# Patient Record
Sex: Female | Born: 1935 | Race: White | Hispanic: No | Marital: Married | Smoking: Never smoker
Health system: Southern US, Community
[De-identification: ages and names within clinical notes are randomized; demographics above are authoritative.]

## PROBLEM LIST (undated history)

## (undated) DIAGNOSIS — E119 Type 2 diabetes mellitus without complications: Secondary | ICD-10-CM

## (undated) DIAGNOSIS — G4730 Sleep apnea, unspecified: Secondary | ICD-10-CM

## (undated) DIAGNOSIS — S72001A Fracture of unspecified part of neck of right femur, initial encounter for closed fracture: Secondary | ICD-10-CM

## (undated) DIAGNOSIS — L409 Psoriasis, unspecified: Secondary | ICD-10-CM

## (undated) DIAGNOSIS — E785 Hyperlipidemia, unspecified: Secondary | ICD-10-CM

## (undated) HISTORY — PX: BREAST BIOPSY: SHX20

## (undated) HISTORY — PX: OTHER SURGICAL HISTORY: SHX169

---

## 2010-11-09 ENCOUNTER — Encounter: Admission: RE | Admit: 2010-11-09 | Discharge: 2010-11-09 | Payer: Self-pay | Admitting: Neurological Surgery

## 2012-01-05 ENCOUNTER — Ambulatory Visit (INDEPENDENT_AMBULATORY_CARE_PROVIDER_SITE_OTHER): Payer: Medicare Other

## 2012-01-05 DIAGNOSIS — J159 Unspecified bacterial pneumonia: Secondary | ICD-10-CM

## 2012-01-05 DIAGNOSIS — R05 Cough: Secondary | ICD-10-CM

## 2012-01-05 DIAGNOSIS — R0602 Shortness of breath: Secondary | ICD-10-CM

## 2012-01-05 DIAGNOSIS — R059 Cough, unspecified: Secondary | ICD-10-CM

## 2012-01-07 ENCOUNTER — Ambulatory Visit (INDEPENDENT_AMBULATORY_CARE_PROVIDER_SITE_OTHER): Payer: Medicare Other

## 2012-01-07 DIAGNOSIS — IMO0001 Reserved for inherently not codable concepts without codable children: Secondary | ICD-10-CM

## 2012-01-07 DIAGNOSIS — J159 Unspecified bacterial pneumonia: Secondary | ICD-10-CM

## 2012-01-10 ENCOUNTER — Ambulatory Visit (INDEPENDENT_AMBULATORY_CARE_PROVIDER_SITE_OTHER): Payer: Medicare Other

## 2012-01-10 DIAGNOSIS — J159 Unspecified bacterial pneumonia: Secondary | ICD-10-CM

## 2014-03-07 ENCOUNTER — Inpatient Hospital Stay (HOSPITAL_COMMUNITY)
Admission: EM | Admit: 2014-03-07 | Discharge: 2014-03-11 | DRG: 470 | Disposition: A | Discharge status: Rehabilitation Facility | Payer: Medicare Other | Attending: Internal Medicine | Admitting: Internal Medicine

## 2014-03-07 DIAGNOSIS — R030 Elevated blood-pressure reading, without diagnosis of hypertension: Secondary | ICD-10-CM | POA: Diagnosis present

## 2014-03-07 DIAGNOSIS — W010XXA Fall on same level from slipping, tripping and stumbling without subsequent striking against object, initial encounter: Secondary | ICD-10-CM | POA: Diagnosis present

## 2014-03-07 DIAGNOSIS — E1142 Type 2 diabetes mellitus with diabetic polyneuropathy: Secondary | ICD-10-CM | POA: Diagnosis present

## 2014-03-07 DIAGNOSIS — D72829 Elevated white blood cell count, unspecified: Secondary | ICD-10-CM | POA: Diagnosis present

## 2014-03-07 DIAGNOSIS — G4733 Obstructive sleep apnea (adult) (pediatric): Secondary | ICD-10-CM | POA: Diagnosis present

## 2014-03-07 DIAGNOSIS — Z79899 Other long term (current) drug therapy: Secondary | ICD-10-CM

## 2014-03-07 DIAGNOSIS — K219 Gastro-esophageal reflux disease without esophagitis: Secondary | ICD-10-CM | POA: Diagnosis present

## 2014-03-07 DIAGNOSIS — D62 Acute posthemorrhagic anemia: Secondary | ICD-10-CM | POA: Diagnosis not present

## 2014-03-07 DIAGNOSIS — IMO0001 Reserved for inherently not codable concepts without codable children: Secondary | ICD-10-CM | POA: Diagnosis present

## 2014-03-07 DIAGNOSIS — L408 Other psoriasis: Secondary | ICD-10-CM | POA: Diagnosis present

## 2014-03-07 DIAGNOSIS — W19XXXA Unspecified fall, initial encounter: Secondary | ICD-10-CM

## 2014-03-07 DIAGNOSIS — Z7901 Long term (current) use of anticoagulants: Secondary | ICD-10-CM

## 2014-03-07 DIAGNOSIS — S72001A Fracture of unspecified part of neck of right femur, initial encounter for closed fracture: Secondary | ICD-10-CM | POA: Diagnosis present

## 2014-03-07 DIAGNOSIS — K5900 Constipation, unspecified: Secondary | ICD-10-CM | POA: Diagnosis not present

## 2014-03-07 DIAGNOSIS — E119 Type 2 diabetes mellitus without complications: Secondary | ICD-10-CM | POA: Diagnosis present

## 2014-03-07 DIAGNOSIS — Y92009 Unspecified place in unspecified non-institutional (private) residence as the place of occurrence of the external cause: Secondary | ICD-10-CM

## 2014-03-07 DIAGNOSIS — S72033A Displaced midcervical fracture of unspecified femur, initial encounter for closed fracture: Principal | ICD-10-CM | POA: Diagnosis present

## 2014-03-07 DIAGNOSIS — E1149 Type 2 diabetes mellitus with other diabetic neurological complication: Secondary | ICD-10-CM | POA: Diagnosis present

## 2014-03-07 DIAGNOSIS — E785 Hyperlipidemia, unspecified: Secondary | ICD-10-CM | POA: Diagnosis present

## 2014-03-07 DIAGNOSIS — Z880 Allergy status to penicillin: Secondary | ICD-10-CM

## 2014-03-07 HISTORY — DX: Fracture of unspecified part of neck of right femur, initial encounter for closed fracture: S72.001A

## 2014-03-07 HISTORY — DX: Hyperlipidemia, unspecified: E78.5

## 2014-03-07 HISTORY — DX: Psoriasis, unspecified: L40.9

## 2014-03-07 HISTORY — DX: Sleep apnea, unspecified: G47.30

## 2014-03-07 HISTORY — DX: Type 2 diabetes mellitus without complications: E11.9

## 2014-03-07 NOTE — ED Notes (Signed)
Per EMS: pt coming from home with c/o right hip pain. Pt was getting up from bed and tripped on a dog bed. Pt denies LOC, no head trauma. Pt states she can not move her right leg but can move toes and feet. Pt has good pulses, color, and sensation. Pt was given 50 mcg of Fentanyl and 4 mg of Zofran. Pt is A&Ox4, respirations equal and unlabored, skin warm and dry.

## 2014-03-08 ENCOUNTER — Other Ambulatory Visit: Payer: Self-pay

## 2014-03-08 ENCOUNTER — Encounter (HOSPITAL_COMMUNITY): Payer: Self-pay | Admitting: Emergency Medicine

## 2014-03-08 ENCOUNTER — Encounter (HOSPITAL_COMMUNITY)
Admission: EM | Disposition: A | Discharge status: Rehabilitation Facility | Payer: Self-pay | Source: Home / Self Care | Attending: Internal Medicine

## 2014-03-08 ENCOUNTER — Emergency Department (HOSPITAL_COMMUNITY): Payer: Medicare Other

## 2014-03-08 ENCOUNTER — Inpatient Hospital Stay (HOSPITAL_COMMUNITY): Payer: Medicare Other

## 2014-03-08 ENCOUNTER — Encounter (HOSPITAL_COMMUNITY): Payer: Medicare Other | Admitting: Certified Registered Nurse Anesthetist

## 2014-03-08 ENCOUNTER — Inpatient Hospital Stay (HOSPITAL_COMMUNITY): Payer: Medicare Other | Admitting: Certified Registered Nurse Anesthetist

## 2014-03-08 DIAGNOSIS — S72001A Fracture of unspecified part of neck of right femur, initial encounter for closed fracture: Secondary | ICD-10-CM

## 2014-03-08 DIAGNOSIS — R030 Elevated blood-pressure reading, without diagnosis of hypertension: Secondary | ICD-10-CM

## 2014-03-08 DIAGNOSIS — D72829 Elevated white blood cell count, unspecified: Secondary | ICD-10-CM

## 2014-03-08 DIAGNOSIS — IMO0001 Reserved for inherently not codable concepts without codable children: Secondary | ICD-10-CM | POA: Diagnosis present

## 2014-03-08 DIAGNOSIS — S72009A Fracture of unspecified part of neck of unspecified femur, initial encounter for closed fracture: Secondary | ICD-10-CM

## 2014-03-08 DIAGNOSIS — E119 Type 2 diabetes mellitus without complications: Secondary | ICD-10-CM

## 2014-03-08 HISTORY — PX: HIP ARTHROPLASTY: SHX981

## 2014-03-08 HISTORY — DX: Fracture of unspecified part of neck of right femur, initial encounter for closed fracture: S72.001A

## 2014-03-08 LAB — SURGICAL PCR SCREEN
MRSA, PCR: NEGATIVE
STAPHYLOCOCCUS AUREUS: NEGATIVE

## 2014-03-08 LAB — URINALYSIS, ROUTINE W REFLEX MICROSCOPIC
Bilirubin Urine: NEGATIVE
Glucose, UA: >1000 mg/dL — AB
Hgb urine dipstick: NEGATIVE
KETONES UR: 40 mg/dL — AB
LEUKOCYTES UA: NEGATIVE
NITRITE: NEGATIVE
PH: 5 (ref 5.0–8.0)
Protein, ur: NEGATIVE mg/dL
Specific Gravity, Urine: 1.024 (ref 1.005–1.030)
Urobilinogen, UA: 0.2 mg/dL (ref 0.0–1.0)

## 2014-03-08 LAB — URINE MICROSCOPIC-ADD ON

## 2014-03-08 LAB — COMPREHENSIVE METABOLIC PANEL
ALBUMIN: 3.8 g/dL (ref 3.5–5.2)
ALK PHOS: 71 U/L (ref 39–117)
ALT: 20 U/L (ref 0–35)
ALT: 21 U/L (ref 0–35)
AST: 24 U/L (ref 0–37)
AST: 23 U/L (ref 0–37)
Albumin: 3.8 g/dL (ref 3.5–5.2)
Alkaline Phosphatase: 74 U/L (ref 39–117)
BILIRUBIN TOTAL: 0.3 mg/dL (ref 0.3–1.2)
BUN: 17 mg/dL (ref 6–23)
BUN: 16 mg/dL (ref 6–23)
CALCIUM: 9.7 mg/dL (ref 8.4–10.5)
CHLORIDE: 98 meq/L (ref 96–112)
CHLORIDE: 100 meq/L (ref 96–112)
CO2: 22 mEq/L (ref 19–32)
CO2: 20 mEq/L (ref 19–32)
CREATININE: 1.04 mg/dL (ref 0.50–1.10)
CREATININE: 0.9 mg/dL (ref 0.50–1.10)
Calcium: 9.6 mg/dL (ref 8.4–10.5)
GFR calc Af Amer: 56 mL/min — ABNORMAL LOW (ref >90)
GFR calc Af Amer: 66 mL/min — ABNORMAL LOW (ref >90)
GFR calc non Af Amer: 48 mL/min — ABNORMAL LOW (ref >90)
GFR calc non Af Amer: 57 mL/min — ABNORMAL LOW (ref >90)
Glucose, Bld: 245 mg/dL — ABNORMAL HIGH (ref 70–99)
Glucose, Bld: 229 mg/dL — ABNORMAL HIGH (ref 70–99)
POTASSIUM: 4 meq/L (ref 3.7–5.3)
Potassium: 4.2 mEq/L (ref 3.7–5.3)
SODIUM: 138 meq/L (ref 137–147)
Sodium: 140 mEq/L (ref 137–147)
TOTAL PROTEIN: 7.5 g/dL (ref 6.0–8.3)
Total Bilirubin: 0.4 mg/dL (ref 0.3–1.2)
Total Protein: 7.3 g/dL (ref 6.0–8.3)

## 2014-03-08 LAB — CBC WITH DIFFERENTIAL/PLATELET
BASOS ABS: 0 10*3/uL (ref 0.0–0.1)
BASOS PCT: 0 % (ref 0–1)
Basophils Absolute: 0 10*3/uL (ref 0.0–0.1)
Basophils Relative: 0 % (ref 0–1)
EOS ABS: 0 10*3/uL (ref 0.0–0.7)
EOS PCT: 0 % (ref 0–5)
Eosinophils Absolute: 0.1 10*3/uL (ref 0.0–0.7)
Eosinophils Relative: 0 % (ref 0–5)
HCT: 39 % (ref 36.0–46.0)
HCT: 39.3 % (ref 36.0–46.0)
HEMOGLOBIN: 13.3 g/dL (ref 12.0–15.0)
Hemoglobin: 13.4 g/dL (ref 12.0–15.0)
LYMPHS ABS: 1.2 10*3/uL (ref 0.7–4.0)
LYMPHS PCT: 6 % — AB (ref 12–46)
Lymphocytes Relative: 8 % — ABNORMAL LOW (ref 12–46)
Lymphs Abs: 1.4 10*3/uL (ref 0.7–4.0)
MCH: 32.8 pg (ref 26.0–34.0)
MCH: 32.4 pg (ref 26.0–34.0)
MCHC: 34.4 g/dL (ref 30.0–36.0)
MCHC: 33.8 g/dL (ref 30.0–36.0)
MCV: 95.4 fL (ref 78.0–100.0)
MCV: 95.6 fL (ref 78.0–100.0)
Monocytes Absolute: 0.8 10*3/uL (ref 0.1–1.0)
Monocytes Absolute: 1.6 10*3/uL — ABNORMAL HIGH (ref 0.1–1.0)
Monocytes Relative: 4 % (ref 3–12)
Monocytes Relative: 8 % (ref 3–12)
NEUTROS ABS: 15 10*3/uL — AB (ref 1.7–7.7)
NEUTROS PCT: 87 % — AB (ref 43–77)
Neutro Abs: 16.1 10*3/uL — ABNORMAL HIGH (ref 1.7–7.7)
Neutrophils Relative %: 85 % — ABNORMAL HIGH (ref 43–77)
PLATELETS: 260 10*3/uL (ref 150–400)
Platelets: 258 10*3/uL (ref 150–400)
RBC: 4.09 MIL/uL (ref 3.87–5.11)
RBC: 4.11 MIL/uL (ref 3.87–5.11)
RDW: 12.9 % (ref 11.5–15.5)
RDW: 13.1 % (ref 11.5–15.5)
WBC: 17.2 10*3/uL — AB (ref 4.0–10.5)
WBC: 18.9 10*3/uL — AB (ref 4.0–10.5)

## 2014-03-08 LAB — GLUCOSE, CAPILLARY
GLUCOSE-CAPILLARY: 233 mg/dL — AB (ref 70–99)
GLUCOSE-CAPILLARY: 169 mg/dL — AB (ref 70–99)
GLUCOSE-CAPILLARY: 153 mg/dL — AB (ref 70–99)
GLUCOSE-CAPILLARY: 178 mg/dL — AB (ref 70–99)
Glucose-Capillary: 107 mg/dL — ABNORMAL HIGH (ref 70–99)
Glucose-Capillary: 166 mg/dL — ABNORMAL HIGH (ref 70–99)

## 2014-03-08 LAB — URINE CULTURE: Colony Count: 35000

## 2014-03-08 LAB — PROTIME-INR
INR: 1.02 (ref 0.00–1.49)
INR: 1.08 (ref 0.00–1.49)
Prothrombin Time: 13.2 seconds (ref 11.6–15.2)
Prothrombin Time: 13.8 seconds (ref 11.6–15.2)

## 2014-03-08 LAB — TYPE AND SCREEN
ABO/RH(D): B POS
Antibody Screen: NEGATIVE

## 2014-03-08 LAB — ABO/RH: ABO/RH(D): B POS

## 2014-03-08 SURGERY — HEMIARTHROPLASTY, HIP, DIRECT ANTERIOR APPROACH, FOR FRACTURE
Anesthesia: General | Laterality: Right

## 2014-03-08 MED ORDER — FENTANYL CITRATE 0.05 MG/ML IJ SOLN
INTRAMUSCULAR | Status: DC | PRN
  Administered 2014-03-08: 75 ug via INTRAVENOUS
  Administered 2014-03-08: 25 ug via INTRAVENOUS
  Administered 2014-03-08: 75 ug via INTRAVENOUS
  Administered 2014-03-08: 25 ug via INTRAVENOUS

## 2014-03-08 MED ORDER — MORPHINE SULFATE 2 MG/ML IJ SOLN
0.5000 mg | INTRAMUSCULAR | Status: DC | PRN
  Administered 2014-03-08: 0.5 mg via INTRAVENOUS
  Filled 2014-03-08: qty 1

## 2014-03-08 MED ORDER — FENTANYL CITRATE 0.05 MG/ML IJ SOLN
INTRAMUSCULAR | Status: AC
  Filled 2014-03-08: qty 5

## 2014-03-08 MED ORDER — ONDANSETRON HCL 4 MG/2ML IJ SOLN
4.0000 mg | Freq: Once | INTRAMUSCULAR | Status: AC
  Administered 2014-03-08: 4 mg via INTRAVENOUS
  Filled 2014-03-08: qty 2

## 2014-03-08 MED ORDER — PHENYLEPHRINE HCL 10 MG/ML IJ SOLN
INTRAMUSCULAR | Status: DC | PRN
  Administered 2014-03-08 (×2): 80 ug via INTRAVENOUS
  Administered 2014-03-08 (×3): 40 ug via INTRAVENOUS
  Administered 2014-03-08: 80 ug via INTRAVENOUS
  Administered 2014-03-08: 40 ug via INTRAVENOUS
  Administered 2014-03-08 (×2): 80 ug via INTRAVENOUS
  Administered 2014-03-08 (×2): 40 ug via INTRAVENOUS
  Administered 2014-03-08: 80 ug via INTRAVENOUS

## 2014-03-08 MED ORDER — HYDROCODONE-ACETAMINOPHEN 5-325 MG PO TABS: 1.0000 | ORAL_TABLET | Freq: Four times a day (QID) | ORAL | Status: DC | PRN

## 2014-03-08 MED ORDER — ONDANSETRON HCL 4 MG/2ML IJ SOLN: 4.0000 mg | Freq: Once | INTRAMUSCULAR | Status: DC | PRN

## 2014-03-08 MED ORDER — DOCUSATE SODIUM 100 MG PO CAPS
100.0000 mg | ORAL_CAPSULE | Freq: Two times a day (BID) | ORAL | Status: DC
  Administered 2014-03-08 – 2014-03-11 (×5): 100 mg via ORAL
  Filled 2014-03-08 (×7): qty 1

## 2014-03-08 MED ORDER — ENOXAPARIN SODIUM 40 MG/0.4ML ~~LOC~~ SOLN
40.0000 mg | SUBCUTANEOUS | Status: DC
  Administered 2014-03-09 – 2014-03-11 (×3): 40 mg via SUBCUTANEOUS
  Filled 2014-03-08 (×4): qty 0.4

## 2014-03-08 MED ORDER — PROPOFOL 10 MG/ML IV BOLUS
INTRAVENOUS | Status: AC
  Filled 2014-03-08: qty 20

## 2014-03-08 MED ORDER — METOCLOPRAMIDE HCL 10 MG PO TABS: 5.0000 mg | ORAL_TABLET | Freq: Three times a day (TID) | ORAL | Status: DC | PRN

## 2014-03-08 MED ORDER — SENNA 8.6 MG PO TABS
1.0000 | ORAL_TABLET | Freq: Two times a day (BID) | ORAL | Status: DC
  Administered 2014-03-08 – 2014-03-10 (×4): 8.6 mg via ORAL
  Filled 2014-03-08 (×7): qty 1

## 2014-03-08 MED ORDER — BISACODYL 10 MG RE SUPP: 10.0000 mg | Freq: Every day | RECTAL | Status: DC | PRN

## 2014-03-08 MED ORDER — MENTHOL 3 MG MT LOZG: 1.0000 | LOZENGE | OROMUCOSAL | Status: DC | PRN

## 2014-03-08 MED ORDER — ALUM & MAG HYDROXIDE-SIMETH 200-200-20 MG/5ML PO SUSP: 30.0000 mL | ORAL | Status: DC | PRN

## 2014-03-08 MED ORDER — MAGNESIUM CITRATE PO SOLN: 1.0000 | Freq: Once | ORAL | Status: AC | PRN

## 2014-03-08 MED ORDER — ONDANSETRON HCL 4 MG/2ML IJ SOLN
INTRAMUSCULAR | Status: DC | PRN
  Administered 2014-03-08: 4 mg via INTRAVENOUS

## 2014-03-08 MED ORDER — ACETAMINOPHEN 325 MG PO TABS
650.0000 mg | ORAL_TABLET | Freq: Four times a day (QID) | ORAL | Status: DC | PRN
  Administered 2014-03-09 – 2014-03-10 (×4): 650 mg via ORAL
  Filled 2014-03-08 (×4): qty 2

## 2014-03-08 MED ORDER — MORPHINE SULFATE 4 MG/ML IJ SOLN
4.0000 mg | Freq: Once | INTRAMUSCULAR | Status: AC
  Administered 2014-03-08: 4 mg via INTRAVENOUS
  Filled 2014-03-08: qty 1

## 2014-03-08 MED ORDER — SIMVASTATIN 40 MG PO TABS
40.0000 mg | ORAL_TABLET | Freq: Every day | ORAL | Status: DC
  Administered 2014-03-08 – 2014-03-10 (×3): 40 mg via ORAL
  Filled 2014-03-08 (×4): qty 1

## 2014-03-08 MED ORDER — LACTATED RINGERS IV SOLN
INTRAVENOUS | Status: DC | PRN
  Administered 2014-03-08 (×2): via INTRAVENOUS

## 2014-03-08 MED ORDER — ALBUMIN HUMAN 5 % IV SOLN
INTRAVENOUS | Status: DC | PRN
  Administered 2014-03-08: 15:00:00 via INTRAVENOUS

## 2014-03-08 MED ORDER — POTASSIUM CHLORIDE IN NACL 20-0.45 MEQ/L-% IV SOLN
INTRAVENOUS | Status: DC
  Administered 2014-03-08: 19:00:00 via INTRAVENOUS
  Administered 2014-03-09: 75 mL/h via INTRAVENOUS
  Filled 2014-03-08 (×3): qty 1000

## 2014-03-08 MED ORDER — CEFAZOLIN SODIUM-DEXTROSE 2-3 GM-% IV SOLR
2.0000 g | INTRAVENOUS | Status: AC
  Administered 2014-03-08: 2 g via INTRAVENOUS
  Filled 2014-03-08: qty 50

## 2014-03-08 MED ORDER — MORPHINE SULFATE 2 MG/ML IJ SOLN
0.5000 mg | INTRAMUSCULAR | Status: DC | PRN
  Administered 2014-03-08 – 2014-03-09 (×3): 0.5 mg via INTRAVENOUS
  Filled 2014-03-08 (×3): qty 1

## 2014-03-08 MED ORDER — CEFAZOLIN SODIUM-DEXTROSE 2-3 GM-% IV SOLR
2.0000 g | Freq: Four times a day (QID) | INTRAVENOUS | Status: AC
  Administered 2014-03-08 – 2014-03-09 (×2): 2 g via INTRAVENOUS
  Filled 2014-03-08 (×2): qty 50

## 2014-03-08 MED ORDER — ACETAMINOPHEN 325 MG PO TABS: 325.0000 mg | ORAL_TABLET | ORAL | Status: DC | PRN

## 2014-03-08 MED ORDER — HYDRALAZINE HCL 20 MG/ML IJ SOLN: 10.0000 mg | INTRAMUSCULAR | Status: DC | PRN

## 2014-03-08 MED ORDER — ACETAMINOPHEN 160 MG/5ML PO SOLN
325.0000 mg | ORAL | Status: DC | PRN
  Filled 2014-03-08: qty 20.3

## 2014-03-08 MED ORDER — SODIUM CHLORIDE 0.9 % IV SOLN
INTRAVENOUS | Status: DC
  Administered 2014-03-08: 04:00:00 via INTRAVENOUS

## 2014-03-08 MED ORDER — METOCLOPRAMIDE HCL 5 MG/ML IJ SOLN: 5.0000 mg | Freq: Three times a day (TID) | INTRAMUSCULAR | Status: DC | PRN

## 2014-03-08 MED ORDER — SODIUM CHLORIDE 0.9 % IR SOLN
Status: DC | PRN
  Administered 2014-03-08 (×2): 1000 mL

## 2014-03-08 MED ORDER — WHITE PETROLATUM GEL
Status: AC
  Filled 2014-03-08: qty 5

## 2014-03-08 MED ORDER — NEOSTIGMINE METHYLSULFATE 1 MG/ML IJ SOLN
INTRAMUSCULAR | Status: DC | PRN
  Administered 2014-03-08: 4 mg via INTRAVENOUS

## 2014-03-08 MED ORDER — HYDROCODONE-ACETAMINOPHEN 5-325 MG PO TABS
1.0000 | ORAL_TABLET | Freq: Four times a day (QID) | ORAL | Status: DC | PRN
  Administered 2014-03-09 – 2014-03-11 (×6): 1 via ORAL
  Filled 2014-03-08 (×6): qty 1

## 2014-03-08 MED ORDER — ACETAMINOPHEN 650 MG RE SUPP: 650.0000 mg | Freq: Four times a day (QID) | RECTAL | Status: DC | PRN

## 2014-03-08 MED ORDER — BUPIVACAINE HCL (PF) 0.5 % IJ SOLN
INTRAMUSCULAR | Status: DC | PRN
  Administered 2014-03-08: 3 mL

## 2014-03-08 MED ORDER — FENTANYL CITRATE 0.05 MG/ML IJ SOLN: 25.0000 ug | INTRAMUSCULAR | Status: DC | PRN

## 2014-03-08 MED ORDER — FENTANYL CITRATE 0.05 MG/ML IJ SOLN
25.0000 ug | INTRAMUSCULAR | Status: DC | PRN
  Administered 2014-03-08: 50 ug via INTRAVENOUS

## 2014-03-08 MED ORDER — ROCURONIUM BROMIDE 100 MG/10ML IV SOLN
INTRAVENOUS | Status: DC | PRN
  Administered 2014-03-08: 35 mg via INTRAVENOUS

## 2014-03-08 MED ORDER — POLYETHYLENE GLYCOL 3350 17 G PO PACK: 17.0000 g | PACK | Freq: Every day | ORAL | Status: DC | PRN

## 2014-03-08 MED ORDER — BUPIVACAINE HCL (PF) 0.25 % IJ SOLN
INTRAMUSCULAR | Status: DC | PRN
  Administered 2014-03-08: 10 mL

## 2014-03-08 MED ORDER — ENOXAPARIN SODIUM 40 MG/0.4ML ~~LOC~~ SOLN: 40.0000 mg | SUBCUTANEOUS | Status: DC

## 2014-03-08 MED ORDER — PANTOPRAZOLE SODIUM 40 MG PO TBEC
80.0000 mg | DELAYED_RELEASE_TABLET | Freq: Every day | ORAL | Status: DC
  Administered 2014-03-08 – 2014-03-11 (×4): 80 mg via ORAL
  Filled 2014-03-08 (×4): qty 2

## 2014-03-08 MED ORDER — ONDANSETRON HCL 4 MG/2ML IJ SOLN
4.0000 mg | Freq: Four times a day (QID) | INTRAMUSCULAR | Status: DC | PRN
  Filled 2014-03-08: qty 2

## 2014-03-08 MED ORDER — CLOBETASOL PROPIONATE 0.05 % EX CREA
1.0000 "application " | TOPICAL_CREAM | Freq: Every day | CUTANEOUS | Status: DC | PRN
  Filled 2014-03-08: qty 15

## 2014-03-08 MED ORDER — BUPIVACAINE HCL (PF) 0.25 % IJ SOLN
INTRAMUSCULAR | Status: AC
  Filled 2014-03-08: qty 30

## 2014-03-08 MED ORDER — INSULIN ASPART 100 UNIT/ML ~~LOC~~ SOLN
0.0000 [IU] | Freq: Three times a day (TID) | SUBCUTANEOUS | Status: DC
  Administered 2014-03-08 – 2014-03-09 (×4): 3 [IU] via SUBCUTANEOUS
  Administered 2014-03-09: 2 [IU] via SUBCUTANEOUS
  Administered 2014-03-10: 3 [IU] via SUBCUTANEOUS
  Administered 2014-03-10 – 2014-03-11 (×4): 2 [IU] via SUBCUTANEOUS

## 2014-03-08 MED ORDER — FENTANYL CITRATE 0.05 MG/ML IJ SOLN
INTRAMUSCULAR | Status: AC
Start: 2014-03-08 — End: 2014-03-09
  Filled 2014-03-08: qty 2

## 2014-03-08 MED ORDER — PANTOPRAZOLE SODIUM 40 MG PO TBEC: 40.0000 mg | DELAYED_RELEASE_TABLET | Freq: Every day | ORAL | Status: DC

## 2014-03-08 MED ORDER — PROPOFOL 10 MG/ML IV BOLUS
INTRAVENOUS | Status: DC | PRN
  Administered 2014-03-08: 70 mg via INTRAVENOUS

## 2014-03-08 MED ORDER — SENNA-DOCUSATE SODIUM 8.6-50 MG PO TABS: 2.0000 | ORAL_TABLET | Freq: Every day | ORAL | Status: DC

## 2014-03-08 MED ORDER — ONDANSETRON HCL 4 MG PO TABS: 4.0000 mg | ORAL_TABLET | Freq: Four times a day (QID) | ORAL | Status: DC | PRN

## 2014-03-08 MED ORDER — PHENOL 1.4 % MT LIQD: 1.0000 | OROMUCOSAL | Status: DC | PRN

## 2014-03-08 MED ORDER — ONDANSETRON HCL 4 MG/2ML IJ SOLN
INTRAMUSCULAR | Status: AC
  Filled 2014-03-08: qty 2

## 2014-03-08 MED ORDER — GLIMEPIRIDE 2 MG PO TABS
2.0000 mg | ORAL_TABLET | Freq: Every day | ORAL | Status: DC
  Administered 2014-03-09 – 2014-03-11 (×3): 2 mg via ORAL
  Filled 2014-03-08 (×4): qty 1

## 2014-03-08 MED ORDER — ACETAMINOPHEN 160 MG/5ML PO SOLN
325.0000 mg | ORAL | Status: DC | PRN
Start: 2014-03-08 — End: 2014-03-08
  Filled 2014-03-08: qty 20.3

## 2014-03-08 MED ORDER — ROCURONIUM BROMIDE 50 MG/5ML IV SOLN
INTRAVENOUS | Status: AC
  Filled 2014-03-08: qty 1

## 2014-03-08 MED ORDER — LIDOCAINE HCL (CARDIAC) 20 MG/ML IV SOLN
INTRAVENOUS | Status: DC | PRN
  Administered 2014-03-08: 70 mg via INTRAVENOUS

## 2014-03-08 MED ORDER — GLYCOPYRROLATE 0.2 MG/ML IJ SOLN
INTRAMUSCULAR | Status: DC | PRN
  Administered 2014-03-08: 0.6 mg via INTRAVENOUS

## 2014-03-08 MED ORDER — LIDOCAINE HCL (CARDIAC) 20 MG/ML IV SOLN
INTRAVENOUS | Status: AC
  Filled 2014-03-08: qty 5

## 2014-03-08 MED ORDER — LACTATED RINGERS IV SOLN
INTRAVENOUS | Status: DC
  Administered 2014-03-08: 12:00:00 via INTRAVENOUS

## 2014-03-08 SURGICAL SUPPLY — 64 items
BENZOIN TINCTURE PRP APPL 2/3 (GAUZE/BANDAGES/DRESSINGS) ×3 IMPLANT
BLADE SAW SGTL 18.5X63.X.64 HD (BLADE) ×3 IMPLANT
BRUSH FEMORAL CANAL (MISCELLANEOUS) ×3 IMPLANT
CAPT HIP FX BIPOLAR/UNIPOLAR ×3 IMPLANT
CEMENT BONE DEPUY (Cement) ×6 IMPLANT
CEMENT RESTRICTOR DEPUY SZ 3 (Cement) ×3 IMPLANT
CLOSURE STERI-STRIP 1/2X4 (GAUZE/BANDAGES/DRESSINGS) ×1
CLSR STERI-STRIP ANTIMIC 1/2X4 (GAUZE/BANDAGES/DRESSINGS) ×2 IMPLANT
COVER BACK TABLE 24X17X13 BIG (DRAPES) IMPLANT
COVER SURGICAL LIGHT HANDLE (MISCELLANEOUS) ×3 IMPLANT
DRAPE INCISE IOBAN 66X45 STRL (DRAPES) IMPLANT
DRAPE ORTHO SPLIT 77X108 STRL (DRAPES) ×4
DRAPE SURG ORHT 6 SPLT 77X108 (DRAPES) ×2 IMPLANT
DRAPE U-SHAPE 47X51 STRL (DRAPES) ×3 IMPLANT
DRILL BIT 5/64 (BIT) ×3 IMPLANT
DRSG MEPILEX BORDER 4X12 (GAUZE/BANDAGES/DRESSINGS) IMPLANT
DRSG MEPILEX BORDER 4X8 (GAUZE/BANDAGES/DRESSINGS) ×3 IMPLANT
DURAPREP 26ML APPLICATOR (WOUND CARE) ×3 IMPLANT
ELECT BLADE 6.5 EXT (BLADE) ×3 IMPLANT
ELECT CAUTERY BLADE 6.4 (BLADE) ×3 IMPLANT
ELECT REM PT RETURN 9FT ADLT (ELECTROSURGICAL) ×3
ELECTRODE REM PT RTRN 9FT ADLT (ELECTROSURGICAL) ×1 IMPLANT
FACESHIELD LNG OPTICON STERILE (SAFETY) ×6 IMPLANT
GLOVE BIO SURGEON STRL SZ 6.5 (GLOVE) ×2 IMPLANT
GLOVE BIO SURGEONS STRL SZ 6.5 (GLOVE) ×1
GLOVE BIOGEL PI IND STRL 6.5 (GLOVE) ×1 IMPLANT
GLOVE BIOGEL PI IND STRL 7.0 (GLOVE) ×1 IMPLANT
GLOVE BIOGEL PI INDICATOR 6.5 (GLOVE) ×2
GLOVE BIOGEL PI INDICATOR 7.0 (GLOVE) ×2
GLOVE BIOGEL PI ORTHO PRO SZ8 (GLOVE) ×2
GLOVE ORTHO TXT STRL SZ7.5 (GLOVE) ×3 IMPLANT
GLOVE PI ORTHO PRO STRL SZ8 (GLOVE) ×1 IMPLANT
GLOVE SURG ORTHO 8.0 STRL STRW (GLOVE) ×6 IMPLANT
GLOVE SURG SS PI 8.0 STRL IVOR (GLOVE) ×3 IMPLANT
GOWN STRL REUS W/ TWL XL LVL3 (GOWN DISPOSABLE) ×1 IMPLANT
GOWN STRL REUS W/TWL 2XL LVL3 (GOWN DISPOSABLE) ×3 IMPLANT
GOWN STRL REUS W/TWL XL LVL3 (GOWN DISPOSABLE) ×2
HANDPIECE INTERPULSE COAX TIP (DISPOSABLE) ×2
KIT BASIN OR (CUSTOM PROCEDURE TRAY) ×3 IMPLANT
KIT ROOM TURNOVER OR (KITS) ×3 IMPLANT
MANIFOLD NEPTUNE II (INSTRUMENTS) ×3 IMPLANT
NEEDLE 22X1 1/2 (OR ONLY) (NEEDLE) ×3 IMPLANT
NS IRRIG 1000ML POUR BTL (IV SOLUTION) ×3 IMPLANT
PACK TOTAL JOINT (CUSTOM PROCEDURE TRAY) ×3 IMPLANT
PAD ARMBOARD 7.5X6 YLW CONV (MISCELLANEOUS) ×6 IMPLANT
PASSER SUT SWANSON 36MM LOOP (INSTRUMENTS) IMPLANT
PILLOW ABDUCTION HIP (SOFTGOODS) ×3 IMPLANT
PRESSURIZER FEMORAL UNIV (MISCELLANEOUS) ×3 IMPLANT
RETRIEVER SUT HEWSON (MISCELLANEOUS) ×3 IMPLANT
SET HNDPC FAN SPRY TIP SCT (DISPOSABLE) ×1 IMPLANT
SUT FIBERWIRE #2 38 REV NDL BL (SUTURE) ×9
SUT MNCRL AB 4-0 PS2 18 (SUTURE) ×3 IMPLANT
SUT VIC AB 0 CT1 27 (SUTURE) ×2
SUT VIC AB 0 CT1 27XBRD ANBCTR (SUTURE) ×1 IMPLANT
SUT VIC AB 1 CT1 27 (SUTURE) ×4
SUT VIC AB 1 CT1 27XBRD ANBCTR (SUTURE) ×2 IMPLANT
SUT VIC AB 3-0 SH 8-18 (SUTURE) ×3 IMPLANT
SUTURE FIBERWR#2 38 REV NDL BL (SUTURE) ×3 IMPLANT
SYR CONTROL 10ML LL (SYRINGE) ×3 IMPLANT
TOWEL OR 17X24 6PK STRL BLUE (TOWEL DISPOSABLE) ×3 IMPLANT
TOWEL OR 17X26 10 PK STRL BLUE (TOWEL DISPOSABLE) ×3 IMPLANT
TOWER CARTRIDGE SMART MIX (DISPOSABLE) ×3 IMPLANT
TRAY FOLEY CATH 16FRSI W/METER (SET/KITS/TRAYS/PACK) IMPLANT
WATER STERILE IRR 1000ML POUR (IV SOLUTION) ×12 IMPLANT

## 2014-03-08 NOTE — H&P (Signed)
[facility] History and Physical  [name] ZOX:[id number] DOB: 11/26/1927 DOA: 03/07/2014  Referring physician: ER physician. PCP: Pcp Not In System patient is visiting from [place].  Chief Complaint: Fall with right hip pain.  HPI: [name] is a 80 y.o. female with history of diabetes mellitus, OSA had a fall yesterday. She did not hit her head or lose consciousness. In the ER x-rays revealed right hip fracture and on call orthopedic surgeon has been consulted. Patient denies any chest pain palpitations shortness of breath headache nausea vomiting or any focal deficits. Patient states he has history of OSA but has not been using CPAP for over a year. Labs revealed mild leukocytosis but patient is afebrile.   Review of Systems: As presented in the history of presenting illness, rest negative.  Past Medical History  Diagnosis Date  . Diabetes mellitus without complication   . Hyperlipidemia   . Sleep apnea   . Psoriasis    Past Surgical History  Procedure Laterality Date  . Knee surgry    . Breast biopsy     Social History:  reports that she has never smoked. She has never used smokeless tobacco. She reports that she does not drink alcohol or use illicit drugs. Where does patient live home. Can patient participate in ADLs? Yes.  Allergies  Allergen Reactions  . Penicillins Other (See Comments)    unknown    Family History:  Family History  Problem Relation Age of Onset  . CAD Neg Hx   . Diabetes Mellitus II Neg Hx       Prior to Admission medications   Medication Sig Start Date End Date Taking? Authorizing Provider  Glimepiride (AMARYL PO) Take 1 tablet by mouth daily.   Yes Historical Provider, MD  METFORMIN HCL PO Take 1 tablet by mouth 2 (two) times daily.   Yes Historical Provider, MD  Omeprazole (PRILOSEC PO) Take 1 capsule by mouth daily.   Yes Historical Provider, MD    Physical Exam: Filed Vitals:   03/08/14 0215 03/08/14 0225 03/08/14  0345 03/08/14 0427  BP: 149/86  137/64 161/78  Pulse: 103 95 99 102  Temp:    98 F (36.7 C)  TempSrc:    Oral  Resp:    18  SpO2: 89% 98% 94% 97%     General:  Well-developed and nourished.  Eyes: Anicteric no pallor.  ENT: No discharge from ears eyes nose mouth.  Neck: No mass felt.  Cardiovascular: S1-S2 heard.  Respiratory: No rhonchi or crepitations.  Abdomen: Soft nontender bowel sounds present.  Skin: No rash.  Musculoskeletal: Pain on moving right hip.  Psychiatric: Appears normal.  Neurologic: Alert awake oriented to time place and person. Moves all extremities.  Labs on Admission:  Basic Metabolic Panel:  Recent Labs Lab 03/08/14 0132  NA 138  K 4.0  CL 98  CO2 22  GLUCOSE 245*  BUN 17  CREATININE 1.04  CALCIUM 9.6   Liver Function Tests:  Recent Labs Lab 03/08/14 0132  AST 24  ALT 20  ALKPHOS 71  BILITOT 0.3  PROT 7.3  ALBUMIN 3.8   No results found for this basename: LIPASE, AMYLASE,  in the last 168 hours No results found for this basename: AMMONIA,  in the last 168 hours CBC:  Recent Labs Lab 03/08/14 0132  WBC 17.2*  NEUTROABS 15.0*  HGB 13.4  HCT 39.0  MCV 95.4  PLT 260   Cardiac Enzymes: No results found for this basename: CKTOTAL,  CKMB, CKMBINDEX, TROPONINI,  in the last 168 hours  BNP (last 3 results) No results found for this basename: PROBNP,  in the last 8760 hours CBG: No results found for this basename: GLUCAP,  in the last 168 hours  Radiological Exams on Admission: Dg Chest 1 View  03/08/2014   CLINICAL DATA:  Fall.  EXAM: CHEST - 1 VIEW  COMPARISON:  ADULT CHEST - 2 VIEWS dated 01/05/2012  FINDINGS: Mediastinum and hilar structures normal. Poor inspiration with bibasilar atelectasis. Heart size and pulmonary vascularity stable. No acute bony abnormality identified.  IMPRESSION: Poor inspiration with mild bibasilar atelectasis.   Electronically Signed   By: Maisie Fushomas  Register   On: 03/08/2014 01:36   Dg  Lumbar Spine Complete  03/08/2014   CLINICAL DATA:  Fall, back pain.  EXAM: LUMBAR SPINE - COMPLETE 4+ VIEW  COMPARISON:  MRI lumbar spine 10/30/2010.  FINDINGS: There is mild convex left scoliosis. No fracture is identified. Trace anterolisthesis L4 on L5 is due to advanced facet arthropathy. Marked loss of disc space height and endplate spurring are seen at L1-2.  IMPRESSION: No acute finding.  Spondylosis as described.   Electronically Signed   By: Drusilla Kannerhomas  Dalessio M.D.   On: 03/08/2014 01:24   Dg Hip Complete Right  03/08/2014   CLINICAL DATA:  Fall.  Right hip pain.  EXAM: RIGHT HIP - COMPLETE 2+ VIEW  COMPARISON:  None.  FINDINGS: The patient has an acute subcapital fracture of the right hip. No other acute bony or joint abnormality is identified.  IMPRESSION: Acute subcapital fracture right hip.   Electronically Signed   By: Drusilla Kannerhomas  Dalessio M.D.   On: 03/08/2014 01:23   Dg Knee Complete 4 Views Right  03/08/2014   CLINICAL DATA:  Status post fall.  Right knee pain.  EXAM: RIGHT KNEE - COMPLETE 4+ VIEW  COMPARISON:  None.  FINDINGS: There is no acute bony or joint abnormality. Advanced tricompartmental osteoarthritis is noted. Trace joint effusion is present.  IMPRESSION: No acute finding.  Advanced tricompartmental degenerative change.   Electronically Signed   By: Drusilla Kannerhomas  Dalessio M.D.   On: 03/08/2014 01:25    Assessment/Plan Principal Problem:   Hip fracture Active Problems:   Diabetes mellitus   Leucocytosis   Elevated blood pressure   1. Right hip fracture status post mechanical fall - from medical standpoint of view patient is stable for surgery. Patient has been kept n.p.o. Continue with pain relief medications. SCD for DVT prophylaxis for now until surgery. Further recommendations per orthopedic surgery. 2. Diabetes mellitus type 2 - I have placed patient on sliding-scale coverage. Resume home medications after surgery. 3. Elevated blood pressure - probably secondary to pain.  Continue pain relief medications. Patient has been placed on IV hydralazine when necessary for systolic blood pressure more than 160. 4. Leukocytosis - probably reactionary. Patient is afebrile. UA is pending. 5. History of OSA presently not using CPAP.  Patient's EKG and UA are pending.  Code Status: Full code.  Family Communication: Patient's daughter at the bedside.  Disposition Plan: Admit to inpatient.    KAKRAKANDY,ARSHAD N. [facility] Pager [phone].  If 7PM-7AM, please contact night-coverage [email or web address] Password [email or web address] 03/08/2014, 4:57 AM

## 2014-03-08 NOTE — ED Provider Notes (Signed)
Medical screening examination/treatment/procedure(s) were conducted as a shared visit with non-physician practitioner(s) and myself.  I personally evaluated the patient during the encounter. Patient is an 80 year old female with history of diabetes and high cholesterol. She presents today after a fall complaining of pain in her right hip. She denies other injury.  On exam vitals are stable and the patient is afebrile. Head is atraumatic normocephalic. Heart is regular rate and rhythm and lungs are clear. There is tenderness to palpation over the lateral aspect of the right hip. There is pain with range of motion. The distal pulses are palpable and she can flex and extend her ankle and toes. Sensation is intact to the foot.  X-rays reveal a femoral neck fracture. The family is requesting Dr. Thurston HoleWainer to perform the surgery. I've spoken with him this evening and he agrees. Patient will be admitted to internal medicine. Dr.Kakrakandy agrees to admit.    Geoffery Lyonsouglas Momo Braun, MD 03/08/14 [phone]

## 2014-03-08 NOTE — Progress Notes (Signed)
Orthopedic Tech Progress Note Patient Details:  Marissa FontFrances Hernandez 11/19/1927 [id number] Unable to use overhead frame     Jennye MoccasinHughes, Kimberlyann Hollar Craig 03/08/2014, 8:22 PM

## 2014-03-08 NOTE — Anesthesia Postprocedure Evaluation (Signed)
Anesthesia Post Note  Patient: [name]  Procedure(s) Performed: Procedure(s) (LRB): ARTHROPLASTY BIPOLAR HIP; RIGHT (Right)  Anesthesia type: general  Patient location: PACU  Post pain: Pain level controlled  Post assessment: Patient's Cardiovascular Status Stable  Last Vitals:  Filed Vitals:   03/08/14 1615  BP:   Pulse: 101  Temp:   Resp: 20    Post vital signs: Reviewed and stable  Level of consciousness: sedated  Complications: No apparent anesthesia complications

## 2014-03-08 NOTE — Op Note (Signed)
03/07/2014 - 03/08/2014  2:33 PM  PATIENT:  [name]   MRN: [id number]  PRE-OPERATIVE DIAGNOSIS:  Right hip fracture  POST-OPERATIVE DIAGNOSIS:  Right hip fracture  PROCEDURE:  Procedure(s): ARTHROPLASTY BIPOLAR HIP; RIGHT  PREOPERATIVE INDICATIONS:  [name] is an 80 y.o. female who was admitted 03/07/2014 with a diagnosis of Fracture of femoral neck, right and elected for surgical management.  The risks benefits and alternatives were discussed with the patient including but not limited to the risks of nonoperative treatment, versus surgical intervention including infection, bleeding, nerve injury, periprosthetic fracture, the need for revision surgery, dislocation, leg length discrepancy, blood clots, cardiopulmonary complications, morbidity, mortality, among others, and they were willing to proceed.  Predicted outcome is good, although there will be at least a six to nine month expected recovery.   OPERATIVE REPORT     SURGEON:  Marchia Bond, MD    ASSISTANT:  Joya Gaskins, OPA-C  (Present throughout the entire procedure,  necessary for completion of procedure in a timely manner, assisting with retraction, instrumentation, and closure)     ANESTHESIA:  General    COMPLICATIONS:  None.      COMPONENTS:  Depuy [place] Basic Femoral Fracture stem size 3, with a 3 stem centralizer, a cement restrictor, with a 0 spacer and a size 50 fracture head unipolar hip ball, and a total of 2 bags of Depuy CMW 1 Bone Cement.    PROCEDURE IN DETAIL: The patient was met in the holding area and identified.  The appropriate hip  was marked at the operative site. The patient was then transported to the OR and  placed under general anesthesia.  At that point, the patient was  placed in the lateral decubitus position with the operative side up and  secured to the operating room table and all bony prominences padded.     The operative lower extremity was prepped from the iliac crest to the toes.   Sterile draping was performed.  Time out was performed prior to incision.      A routine posterolateral approach was utilized via sharp dissection  carried down to the subcutaneous tissue.  Gross bleeders were Bovie  coagulated.  The iliotibial band was identified and incised  along the length of the skin incision.  Self-retaining retractors were  inserted.  With the hip internally rotated, the short external rotators  were identified. The piriformis was tagged with FiberWire, and the hip capsule released in a T-type fashion.  The femoral neck was exposed, and I resected the femoral neck using the appropriate jig. This was performed at approximately a thumb's breadth above the lesser trochanter.    I then exposed the deep acetabulum, cleared out any tissue including the ligamentum teres, and included the hip capsule in the FiberWire used above and below the T.    I then prepared the proximal femur using the cookie-cutter, the lateralizing reamer, and then sequentially broached.  A trial utilized, and I reduced the hip and it was found to have excellent stability with functional range of motion. The trial components were then removed.   I then placed a cement restrictor, and cemented the real components in place. All excess cement was removed. Once the cement had cured, I impacted the real head ball into place. The hip was then reduced and taken through functional range of motion and found to have excellent stability. Leg lengths were restored.  I then used a 2 mm drill bits to pass the FiberWire suture from  the capsule and puriform is through the greater trochanter, and secured this. Excellent posterior capsular repair was achieved. I also closed the T in the capsule.  I then irrigated the hip copiously again with pulse lavage, and repaired the fascia with Vicryl, followed by Vicryl for the subcutaneous tissue, Monocryl for the skin, Steri-Strips and sterile gauze. The wounds were injected. The  patient was then awakened and returned to PACU in stable and satisfactory condition. There were no complications.  Marchia Bond, MD Orthopedic Surgeon [phone]   03/08/2014 2:33 PM

## 2014-03-08 NOTE — ED Provider Notes (Signed)
CSN: [id number]     Arrival date & time 03/07/14  2342 History   First MD Initiated Contact with Patient 03/07/14 2353     No chief complaint on file.    (Consider location/radiation/quality/duration/timing/severity/associated sxs/prior Treatment) HPI 80 yo female presents with acute onset RIGHT hip pain rated at 10/10 with palpation but 1/10 at rest. Patient received 50 mcg of Fentanyl prior to exam. Patient states she tripped and fell over the dog bed at home today at around 930 pm. Patient denies head trauma or LOC. States that she tripped and fell hitting her Right Knee, and then landed on her right hip. Patient unable to move hip. Denies Chest pain, shortness of breath, or dizziness. PMH significant for DM that is controlled. Patient denies any additional PMH.   No past medical history on file. No past surgical history on file. No family history on file. History  Substance Use Topics  . Smoking status: Not on file  . Smokeless tobacco: Not on file  . Alcohol Use: Not on file   OB History   No data available     Review of Systems  All other systems reviewed and are negative.      Allergies  Penicillins  Home Medications   Current Outpatient Rx  Name  Route  Sig  Dispense  Refill  . Glimepiride (AMARYL PO)   Oral   Take 1 tablet by mouth daily.         Marland Kitchen METFORMIN HCL PO   Oral   Take 1 tablet by mouth 2 (two) times daily.         . Omeprazole (PRILOSEC PO)   Oral   Take 1 capsule by mouth daily.          BP 167/93  Temp(Src) 97.7 F (36.5 C) (Oral)  Resp 18  SpO2 98% Physical Exam  Nursing note and vitals reviewed. Constitutional: She is oriented to person, place, and time. She appears well-developed and well-nourished. No distress.  HENT:  Head: Normocephalic and atraumatic.  Eyes: Conjunctivae are normal.  Neck: Normal range of motion. Neck supple. No JVD present. No tracheal deviation present.  Cardiovascular: Normal rate and regular rhythm.   Exam reveals no gallop and no friction rub.   No murmur heard. Pulmonary/Chest: Effort normal. No respiratory distress. She has no wheezes. She has no rhonchi. She has no rales.  Abdominal: Soft. She exhibits no distension. There is no tenderness. There is no rebound and no guarding.  Musculoskeletal: Normal range of motion. She exhibits no edema.  Patient lying in bed with pillow under right knee. Pain to palpation at RIGHT lateral hip and at Right ASIS.  Patient able to move toes. Good sensation of RIGHT leg. Patient states she cannot move her hip. Pain with passive extension of knee that radiates from hip to knee.   Neurological: She is alert and oriented to person, place, and time.  Skin: Skin is warm and dry. She is not diaphoretic.  Psychiatric: She has a normal mood and affect. Her behavior is normal.    ED Course  Procedures (including critical care time) Labs Review Labs Reviewed  CBC WITH DIFFERENTIAL  COMPREHENSIVE METABOLIC PANEL  PROTIME-INR   Imaging Review Dg Chest 1 View  03/08/2014   CLINICAL DATA:  Fall.  EXAM: CHEST - 1 VIEW  COMPARISON:  ADULT CHEST - 2 VIEWS dated 01/05/2012  FINDINGS: Mediastinum and hilar structures normal. Poor inspiration with bibasilar atelectasis. Heart size and pulmonary vascularity stable.  No acute bony abnormality identified.  IMPRESSION: Poor inspiration with mild bibasilar atelectasis.   Electronically Signed   By: Maisie Fushomas  Register   On: 03/08/2014 01:36   Dg Lumbar Spine Complete  03/08/2014   CLINICAL DATA:  Fall, back pain.  EXAM: LUMBAR SPINE - COMPLETE 4+ VIEW  COMPARISON:  MRI lumbar spine 10/30/2010.  FINDINGS: There is mild convex left scoliosis. No fracture is identified. Trace anterolisthesis L4 on L5 is due to advanced facet arthropathy. Marked loss of disc space height and endplate spurring are seen at L1-2.  IMPRESSION: No acute finding.  Spondylosis as described.   Electronically Signed   By: Drusilla Kannerhomas  Dalessio M.D.   On:  03/08/2014 01:24   Dg Hip Complete Right  03/08/2014   CLINICAL DATA:  Fall.  Right hip pain.  EXAM: RIGHT HIP - COMPLETE 2+ VIEW  COMPARISON:  None.  FINDINGS: The patient has an acute subcapital fracture of the right hip. No other acute bony or joint abnormality is identified.  IMPRESSION: Acute subcapital fracture right hip.   Electronically Signed   By: Drusilla Kannerhomas  Dalessio M.D.   On: 03/08/2014 01:23   Dg Knee Complete 4 Views Right  03/08/2014   CLINICAL DATA:  Status post fall.  Right knee pain.  EXAM: RIGHT KNEE - COMPLETE 4+ VIEW  COMPARISON:  None.  FINDINGS: There is no acute bony or joint abnormality. Advanced tricompartmental osteoarthritis is noted. Trace joint effusion is present.  IMPRESSION: No acute finding.  Advanced tricompartmental degenerative change.   Electronically Signed   By: Drusilla Kannerhomas  Dalessio M.D.   On: 03/08/2014 01:25     EKG Interpretation None      MDM   Final diagnoses:  Hip fracture, right  Fall   Plain films of right hip shows Acute subcapital hip fracture.  plain films of Right knee and Lumbar spine show no acute findings Patient discussed with Dr. Geoffery Lyonsouglas Delo. Will consult ortho.  Family Requests Salvatore Marvelobert Wainer as orthopedic surgeon.  CBC, CMP, PT/INR pending. Patient signed over to Dr. Judd Lienelo at shift change.  Patient's pain is currently controlled. Disposition pending ortho consult.          Rudene AndaJacob Gray Talis Iwan, PA-C 03/08/14 [phone]

## 2014-03-08 NOTE — Progress Notes (Signed)
Utilization review completed.  

## 2014-03-08 NOTE — Progress Notes (Signed)
Brief Nutrition Note  RD consult received per hip fracture protocol.  - Emphasized limited sugary snacks/ beverages  - Reinforced continued protein intake at each meal/snack to promote healing and control blood sugar   80 y.o. female who complains of right hip pain after a mechanical fall. She tripped over her dog's bed last night. Pt due for surgery this evening.  Pt eats well and has not lost any weight recently. She is currently on metformin for her DM and her doctor has recently increased her dose. Daughter explains that pt has a sweet tooth and likes to eat candy, and sugar sweetened beverages. Pt explains that she monitors her blood sugar, but is not great about limiting sugary foods/beverages. Pt eats out most meals and includes protein foods at each meal.   Ht: 5\' 3"   Wt: 73.6 kg  BMI: 28.3   Diet order: NPO   Labs and medications reviewed.  -Pt reported her last A1C 6.2.  No further intervention needed at this time.   Eppie Gibsonebekah L Matznick ,BS Nutrition Intern     Intern note/chart reviewed. Revisions made.  Kendell BaneHeather Estefanny Moler RD, LDN, CNSC [phone] Pager [phone] After Hours Pager

## 2014-03-08 NOTE — Discharge Instructions (Signed)
Diet: As you were doing prior to hospitalization  ° °Shower:  May shower but keep the wounds dry, use an occlusive plastic wrap, NO SOAKING IN TUB.  If the bandage gets wet, change with a clean dry gauze. ° °Dressing:  You may change your dressing 3-5 days after surgery.  Then change the dressing daily with sterile gauze dressing.   ° °There are sticky tapes (steri-strips) on your wounds and all the stitches are absorbable.  Leave the steri-strips in place when changing your dressings, they will peel off with time, usually 2-3 weeks. ° °Activity:  Increase activity slowly as tolerated, but follow the weight bearing instructions below.  No lifting or driving for 6 weeks. ° °Weight Bearing:   As tolerated.   ° °To prevent constipation: you may use a stool softener such as - ° °Colace (over the counter) 100 mg by mouth twice a day  °Drink plenty of fluids (prune juice may be helpful) and high fiber foods °Miralax (over the counter) for constipation as needed.   ° °Itching:  If you experience itching with your medications, try taking only a single pain pill, or even half a pain pill at a time.  You may take up to 10 pain pills per day, and you can also use benadryl over the counter for itching or also to help with sleep.  ° °Precautions:  If you experience chest pain or shortness of breath - call 911 immediately for transfer to the hospital emergency department!! ° °If you develop a fever greater that 101 F, purulent drainage from wound, increased redness or drainage from wound, or calf pain -- Call the office at [phone]                                                °Follow- Up Appointment:  Please call for an appointment to be seen in 2 weeks [place] - [phone] ° ° ° ° ° °

## 2014-03-08 NOTE — Transfer of Care (Signed)
Immediate Anesthesia Transfer of Care Note  Patient: [name]  Procedure(s) Performed: Procedure(s): ARTHROPLASTY BIPOLAR HIP; RIGHT (Right)  Patient Location: PACU  Anesthesia Type:General  Level of Consciousness: awake and patient cooperative  Airway & Oxygen Therapy: Patient Spontanous Breathing and Patient connected to face mask oxygen  Post-op Assessment: Report given to PACU RN and Post -op Vital signs reviewed and stable. Still sleepy.  Post vital signs: Reviewed and stable  Complications: No apparent anesthesia complications

## 2014-03-08 NOTE — Consult Note (Signed)
ORTHOPAEDIC CONSULTATION  REQUESTING PHYSICIAN: Joseph ArtJessica U Vann, DO  Chief Complaint: Right hip pain  HPI: Marissa FontFrances Hernandez is a 80 y.o. female who complains of  right hip pain after a mechanical fall. She tripped over her dog's bed last night. She was unable to walk. She denies any other injuries, no loss of consciousness. Pain is rated as severe, worse with movement, better with morphine.  Past Medical History  Diagnosis Date  . Diabetes mellitus without complication   . Hyperlipidemia   . Sleep apnea   . Psoriasis    Past Surgical History  Procedure Laterality Date  . Knee surgry    . Breast biopsy     History   Social History  . Marital Status: Married    Spouse Name: N/A    Number of Children: N/A  . Years of Education: N/A   Social History Main Topics  . Smoking status: Never Smoker   . Smokeless tobacco: Never Used  . Alcohol Use: No  . Drug Use: No  . Sexual Activity: None   Other Topics Concern  . None   Social History Narrative  . None   Family History  Problem Relation Age of Onset  . CAD Neg Hx   . Diabetes Mellitus II Neg Hx    Allergies  Allergen Reactions  . Penicillins Other (See Comments)    unknown   Prior to Admission medications   Medication Sig Start Date End Date Taking? Authorizing Provider  Glimepiride (AMARYL PO) Take 2 mg by mouth daily.    Yes Historical Provider, MD  METFORMIN HCL PO Take 500 mg by mouth 2 (two) times daily.    Yes Historical Provider, MD  Omeprazole (PRILOSEC PO) Take 1 capsule by mouth daily.   Yes Historical Provider, MD   Dg Chest 1 View  03/08/2014   CLINICAL DATA:  Fall.  EXAM: CHEST - 1 VIEW  COMPARISON:  ADULT CHEST - 2 VIEWS dated 01/05/2012  FINDINGS: Mediastinum and hilar structures normal. Poor inspiration with bibasilar atelectasis. Heart size and pulmonary vascularity stable. No acute bony abnormality identified.  IMPRESSION: Poor inspiration with mild bibasilar atelectasis.   Electronically Signed    By: Maisie Fushomas  Register   On: 03/08/2014 01:36   Dg Lumbar Spine Complete  03/08/2014   CLINICAL DATA:  Fall, back pain.  EXAM: LUMBAR SPINE - COMPLETE 4+ VIEW  COMPARISON:  MRI lumbar spine 10/30/2010.  FINDINGS: There is mild convex left scoliosis. No fracture is identified. Trace anterolisthesis L4 on L5 is due to advanced facet arthropathy. Marked loss of disc space height and endplate spurring are seen at L1-2.  IMPRESSION: No acute finding.  Spondylosis as described.   Electronically Signed   By: Drusilla Kannerhomas  Dalessio M.D.   On: 03/08/2014 01:24   Dg Hip Complete Right  03/08/2014   CLINICAL DATA:  Fall.  Right hip pain.  EXAM: RIGHT HIP - COMPLETE 2+ VIEW  COMPARISON:  None.  FINDINGS: The patient has an acute subcapital fracture of the right hip. No other acute bony or joint abnormality is identified.  IMPRESSION: Acute subcapital fracture right hip.   Electronically Signed   By: Drusilla Kannerhomas  Dalessio M.D.   On: 03/08/2014 01:23   Dg Knee Complete 4 Views Right  03/08/2014   CLINICAL DATA:  Status post fall.  Right knee pain.  EXAM: RIGHT KNEE - COMPLETE 4+ VIEW  COMPARISON:  None.  FINDINGS: There is no acute bony or joint abnormality. Advanced tricompartmental osteoarthritis is noted.  Trace joint effusion is present.  IMPRESSION: No acute finding.  Advanced tricompartmental degenerative change.   Electronically Signed   By: Drusilla Kanner M.D.   On: 03/08/2014 01:25    Positive ROS: All other systems have been reviewed and were otherwise negative with the exception of those  the HPI and as above.  Physical Exam: General: Alert, no acute distress Cardiovascular: No pedal edema Respiratory: No cyanosis, no use of accessory musculature GI: No organomegaly, abdomen is soft and non-tender Skin: No lesions in the area of chief complaint Neurologic: Sensation intact distally Psychiatric: Patient is competent for consent with normal mood and affect Lymphatic: No axillary or cervical  lymphadenopathy  MUSCULOSKELETAL: Bilateral upper tremors these are atraumatic. Right leg is shortened and externally rotated, EHL and FHL are firing. Positive log roll.  Left leg is atraumatic.  Assessment: Displaced right femoral neck fracture with risk factors as indicated above, including sleep apnea, and diabetes  Plan: This is an acute severe injury, and threatened her long-term function. I recommended right hip hemiarthroplasty. We will plan for surgery likely later on today.  She is currently relatively comfortable as long she is not moving, and I would try to minimize the amount of narcotics given her age. She will remain n.p.o. for now.  Hold anticoagulation until postoperatively.  The risks benefits and alternatives were discussed with the patient including but not limited to the risks of nonoperative treatment, versus surgical intervention including infection, bleeding, nerve injury, periprosthetic fracture, the need for revision surgery, dislocation, leg length discrepancy, blood clots, cardiopulmonary complications, morbidity, mortality, among others, and they were willing to proceed.       Eulas Post, MD Cell [phone]   03/08/2014 8:03 AM

## 2014-03-08 NOTE — Anesthesia Preprocedure Evaluation (Addendum)
Anesthesia Evaluation  Patient identified by MRN, date of birth, ID band Patient awake    Reviewed: Allergy & Precautions, H&P , NPO status , Patient's Chart, lab work & pertinent test results  History of Anesthesia Complications Negative for: history of anesthetic complications  Airway Mallampati: II TM Distance: >3 FB Neck ROM: Full    Dental  (+) Teeth Intact   Pulmonary sleep apnea and Continuous Positive Airway Pressure Ventilation , neg COPD breath sounds clear to auscultation        Cardiovascular - angina- Past MI, - CHF and - DOE negative cardio ROS  - Valvular Problems/MurmursRhythm:Regular Rate:Normal     Neuro/Psych negative neurological ROS  negative psych ROS   GI/Hepatic negative GI ROS, Neg liver ROS,   Endo/Other  diabetes, Type 2, Oral Hypoglycemic Agents  Renal/GU negative Renal ROS     Musculoskeletal   Abdominal   Peds  Hematology negative hematology ROS (+)   Anesthesia Other Findings   Reproductive/Obstetrics                          Anesthesia Physical Anesthesia Plan  ASA: II  Anesthesia Plan: General   Post-op Pain Management:    Induction: Intravenous  Airway Management Planned: Oral ETT  Additional Equipment: None  Intra-op Plan:   Post-operative Plan: Extubation in OR  Informed Consent: I have reviewed the patients History and Physical, chart, labs and discussed the procedure including the risks, benefits and alternatives for the proposed anesthesia with the patient or authorized representative who has indicated his/her understanding and acceptance.   Dental advisory given  Plan Discussed with: CRNA and Surgeon  Anesthesia Plan Comments:        Anesthesia Quick Evaluation

## 2014-03-08 NOTE — Anesthesia Procedure Notes (Addendum)
Spinal  Patient location during procedure: pre-op Start time: 03/08/2014 12:50 PM End time: 03/08/2014 12:56 PM Staffing Anesthesiologist: Sotirios Navarro, CHRIS Preanesthetic Checklist Completed: patient identified, surgical consent, pre-op evaluation, timeout performed, IV checked, risks and benefits discussed and monitors and equipment checked Spinal Block Patient position: sitting Prep: site prepped and draped and DuraPrep Patient monitoring: heart rate, cardiac monitor, continuous pulse ox and blood pressure Approach: midline Location: L4-5 Injection technique: single-shot Needle Needle type: Pencan  Needle gauge: 24 G Needle length: 10 cm Assessment Sensory level: T8

## 2014-03-08 NOTE — Progress Notes (Signed)
Patient admitted by Dr. Kirtland BouchardK early this AM- please see H&P.  For surgery today by Dr. Wynonia HazardLandau  Jessica Vann DO

## 2014-03-09 LAB — BASIC METABOLIC PANEL
BUN: 11 mg/dL (ref 6–23)
CALCIUM: 8.8 mg/dL (ref 8.4–10.5)
CHLORIDE: 98 meq/L (ref 96–112)
CO2: 23 mEq/L (ref 19–32)
CREATININE: 0.76 mg/dL (ref 0.50–1.10)
GFR calc Af Amer: 87 mL/min — ABNORMAL LOW (ref >90)
GFR calc non Af Amer: 75 mL/min — ABNORMAL LOW (ref >90)
Glucose, Bld: 225 mg/dL — ABNORMAL HIGH (ref 70–99)
Potassium: 4.2 mEq/L (ref 3.7–5.3)
Sodium: 137 mEq/L (ref 137–147)

## 2014-03-09 LAB — CBC
HEMATOCRIT: 32 % — AB (ref 36.0–46.0)
Hemoglobin: 10.8 g/dL — ABNORMAL LOW (ref 12.0–15.0)
MCH: 32.4 pg (ref 26.0–34.0)
MCHC: 33.8 g/dL (ref 30.0–36.0)
MCV: 96.1 fL (ref 78.0–100.0)
PLATELETS: 207 10*3/uL (ref 150–400)
RBC: 3.33 MIL/uL — AB (ref 3.87–5.11)
RDW: 13.2 % (ref 11.5–15.5)
WBC: 17.6 10*3/uL — ABNORMAL HIGH (ref 4.0–10.5)

## 2014-03-09 LAB — GLUCOSE, CAPILLARY
GLUCOSE-CAPILLARY: 156 mg/dL — AB (ref 70–99)
GLUCOSE-CAPILLARY: 159 mg/dL — AB (ref 70–99)
Glucose-Capillary: 249 mg/dL — ABNORMAL HIGH (ref 70–99)
Glucose-Capillary: 204 mg/dL — ABNORMAL HIGH (ref 70–99)

## 2014-03-09 LAB — VITAMIN D 25 HYDROXY (VIT D DEFICIENCY, FRACTURES): Vit D, 25-Hydroxy: 39 ng/mL (ref 30–89)

## 2014-03-09 NOTE — Progress Notes (Signed)
Patient ID: [name], female   DOB: 11/10/1927, 80 y.o.   MRN: [id number]     Subjective:  Patient reports pain as moderate.  States that she had a tough night.  Denies any cp or sob.  Objective:   VITALS:   Filed Vitals:   03/09/14 0000 03/09/14 0117 03/09/14 0400 03/09/14 0531  BP:  132/53  113/51  Pulse:  102  95  Temp:  97.8 F (36.6 C)  97.3 F (36.3 C)  TempSrc:  Oral  Oral  Resp: 18 20 18 18   Height:      Weight:      SpO2: 93% 93%  93%    ABD soft Sensation intact distally Dorsiflexion/Plantar flexion intact Incision: dressing C/D/I and scant drainage Abduction pillow still in place  Lab Results  Component Value Date   WBC 18.9* 03/08/2014   HGB 13.3 03/08/2014   HCT 39.3 03/08/2014   MCV 95.6 03/08/2014   PLT 258 03/08/2014     Assessment/Plan: 1 Day Post-Op   Principal Problem:   Fracture of femoral neck, right Active Problems:   Diabetes mellitus   Leucocytosis   Elevated blood pressure   Advance diet Up with therapy Continue plan per medicine WBAT Plan dressing change tomorrow Okay to DC abduction pillow   DOUGLAS PARRY, BRANDON 03/09/2014, 7:38 AM  Seen and agree with above.   Teryl LucyJoshua Treniece Holsclaw, MD Cell [phone]

## 2014-03-09 NOTE — Progress Notes (Signed)
Clinical Social Work Department  CLINICAL SOCIAL WORK PLACEMENT NOTE  Patient: Maggie FontFrances Burget Account Number: [id number]  Admit date: 03/07/14  Clinical Social Worker: Sabino NiemannAmy Nadim Malia LCSWA Date/time: 03/09/2014 2:30 PM  Clinical Social Work is seeking post-discharge placement for this patient at the following level of care: SKILLED NURSING (*CSW will update this form in Epic as items are completed)  03/09/2014 Patient/family provided with [facility] of Clinical Social Work's list of facilities offering this level of care within the geographic area requested by the patient (or if unable, by the patient's family).  03/09/2014 Patient/family informed of their freedom to choose among providers that offer the needed level of care, that participate in Medicare, Medicaid or managed care program needed by the patient, have an available bed and are willing to accept the patient.  03/09/2014 Patient/family informed of [facility]' ownership interest in [facility], as well as of the fact that they are under no obligation to receive care at this facility.  PASARR submitted to EDS on  PASARR number received from EDS on  FL2 transmitted to all facilities in geographic area requested by pt/family on 03/09/2014  FL2 transmitted to all facilities within larger geographic area on  Patient informed that his/her managed care company has contracts with or will negotiate with certain facilities, including the following:  Patient/family informed of bed offers received:  Patient chooses bed at  Physician recommends and patient chooses bed at  Patient to be transferred to on  Patient to be transferred to facility by  The following physician request were entered in Epic:  Additional Comments:

## 2014-03-09 NOTE — Progress Notes (Signed)
PROGRESS NOTE  [name] AVW:[id number] DOB: 04/05/1927 DOA: 03/07/2014 PCP: Pcp Not In System  Assessment/Plan: Right hip fracture status post mechanical fall -  S/p surgery pain relief medications.  SCD for DVT prophylaxis for now until surgery.  Diabetes mellitus type 2 - ssi  Elevated blood pressure - probably secondary to pain. Continue pain relief medications. Patient has been placed on IV hydralazine when necessary for systolic blood pressure more than 160.   Leukocytosis - probably reactionary. Patient is afebrile. UA is negative.   History of OSA presently not using CPAP  ABLA- monitor and transfuse as necc  Code Status: full Family Communication: child at bedside Disposition Plan: SNF   Consultants:  ortho  Procedures:    Antibiotics:    HPI/Subjective: Able to get up to chair  Pain controlled   Objective: Filed Vitals:   03/09/14 0531  BP: 113/51  Pulse: 95  Temp: 97.3 F (36.3 C)  Resp: 18    Intake/Output Summary (Last 24 hours) at 03/09/14 1107 Last data filed at 03/09/14 0830  Gross per 24 hour  Intake   3530 ml  Output    600 ml  Net   2930 ml   Filed Weights   03/08/14 1300  Weight: 73.483 kg (162 lb)    Exam:  General: Well-developed and nourished.  Neck: No mass felt.  Cardiovascular: S1-S2 heard.  Respiratory: No rhonchi or crepitations.  Abdomen: Soft nontender bowel sounds present.  Skin: No rash.  Neurologic: Alert awake oriented to time place and person. Moves all extremities.   Data Reviewed: Basic Metabolic Panel:  Recent Labs Lab 03/08/14 0132 03/08/14 0730 03/09/14 0540  NA 138 140 137  K 4.0 4.2 4.2  CL 98 100 98  CO2 22 20 23   GLUCOSE 245* 229* 225*  BUN 17 16 11   CREATININE 1.04 0.90 0.76  CALCIUM 9.6 9.7 8.8   Liver Function Tests:  Recent Labs Lab 03/08/14 0132 03/08/14 0730  AST 24 23  ALT 20 21  ALKPHOS 71 74  BILITOT 0.3 0.4  PROT 7.3 7.5  ALBUMIN 3.8 3.8   No results  found for this basename: LIPASE, AMYLASE,  in the last 168 hours No results found for this basename: AMMONIA,  in the last 168 hours CBC:  Recent Labs Lab 03/08/14 0132 03/08/14 0730 03/09/14 0540  WBC 17.2* 18.9* 17.6*  NEUTROABS 15.0* 16.1*  --   HGB 13.4 13.3 10.8*  HCT 39.0 39.3 32.0*  MCV 95.4 95.6 96.1  PLT 260 258 207   Cardiac Enzymes: No results found for this basename: CKTOTAL, CKMB, CKMBINDEX, TROPONINI,  in the last 168 hours BNP (last 3 results) No results found for this basename: PROBNP,  in the last 8760 hours CBG:  Recent Labs Lab 03/08/14 1145 03/08/14 1533 03/08/14 1915 03/08/14 2135 03/09/14 0658  GLUCAP 153* 107* 178* 166* 249*    Recent Results (from the past 240 hour(s))  URINE CULTURE     Status: None   Collection Time    03/08/14  4:35 AM      Result Value Ref Range Status   Specimen Description URINE, CLEAN CATCH   Final   Special Requests NONE   Final   Culture  Setup Time     Final   Value: 03/08/2014 06:12     Performed at [facility] Count     Final   Value: 35,000 COLONIES/ML     Performed at [facility]  Culture     Final   Value: Multiple bacterial morphotypes present, none predominant. Suggest appropriate recollection if clinically indicated.     Performed at [facility]   Report Status 03/08/2014 FINAL   Final  SURGICAL PCR SCREEN     Status: None   Collection Time    03/08/14  8:20 AM      Result Value Ref Range Status   MRSA, PCR NEGATIVE  NEGATIVE Final   Staphylococcus aureus NEGATIVE  NEGATIVE Final   Comment:            The Xpert SA Assay (FDA     approved for NASAL specimens     in patients over 7821 years of age),     is one component of     a comprehensive surveillance     program.  Test performance has     been validated by [facility] for patients greater     than or equal to 78 year old.     It is not intended     to diagnose infection nor to     guide or monitor  treatment.     Studies: Dg Chest 1 View  03/08/2014   CLINICAL DATA:  Fall.  EXAM: CHEST - 1 VIEW  COMPARISON:  ADULT CHEST - 2 VIEWS dated 01/05/2012  FINDINGS: Mediastinum and hilar structures normal. Poor inspiration with bibasilar atelectasis. Heart size and pulmonary vascularity stable. No acute bony abnormality identified.  IMPRESSION: Poor inspiration with mild bibasilar atelectasis.   Electronically Signed   By: Maisie Fushomas  Register   On: 03/08/2014 01:36   Dg Lumbar Spine Complete  03/08/2014   CLINICAL DATA:  Fall, back pain.  EXAM: LUMBAR SPINE - COMPLETE 4+ VIEW  COMPARISON:  MRI lumbar spine 10/30/2010.  FINDINGS: There is mild convex left scoliosis. No fracture is identified. Trace anterolisthesis L4 on L5 is due to advanced facet arthropathy. Marked loss of disc space height and endplate spurring are seen at L1-2.  IMPRESSION: No acute finding.  Spondylosis as described.   Electronically Signed   By: Drusilla Kannerhomas  Dalessio M.D.   On: 03/08/2014 01:24   Dg Hip Complete Right  03/08/2014   CLINICAL DATA:  Fall.  Right hip pain.  EXAM: RIGHT HIP - COMPLETE 2+ VIEW  COMPARISON:  None.  FINDINGS: The patient has an acute subcapital fracture of the right hip. No other acute bony or joint abnormality is identified.  IMPRESSION: Acute subcapital fracture right hip.   Electronically Signed   By: Drusilla Kannerhomas  Dalessio M.D.   On: 03/08/2014 01:23   Dg Pelvis Portable  03/08/2014   CLINICAL DATA:  Post right hip arthroplasty  EXAM: PORTABLE PELVIS 1-2 VIEWS; PORTABLE RIGHT HIP - 1 VIEW  COMPARISON:  Preoperative radiographs 03/08/2014  FINDINGS: Interval surgical changes of right total hip arthroplasty. No evidence of hardware complication. The visualized bony pelvis is intact. Degenerative change again noted at the pubic symphysis. The femoral head prosthesis is located on the cross-table lateral view.  IMPRESSION: Right total hip arthroplasty repair of transcervical femoral neck fracture. No evidence of immediate  hardware complication.   Electronically Signed   By: Malachy MoanHeath  McCullough M.D.   On: 03/08/2014 17:47   Dg Hip Portable 1 View Right  03/08/2014   CLINICAL DATA:  Post right hip arthroplasty  EXAM: PORTABLE PELVIS 1-2 VIEWS; PORTABLE RIGHT HIP - 1 VIEW  COMPARISON:  Preoperative radiographs 03/08/2014  FINDINGS: Interval surgical changes of right  total hip arthroplasty. No evidence of hardware complication. The visualized bony pelvis is intact. Degenerative change again noted at the pubic symphysis. The femoral head prosthesis is located on the cross-table lateral view.  IMPRESSION: Right total hip arthroplasty repair of transcervical femoral neck fracture. No evidence of immediate hardware complication.   Electronically Signed   By: Malachy Moan M.D.   On: 03/08/2014 17:47   Dg Knee Complete 4 Views Right  03/08/2014   CLINICAL DATA:  Status post fall.  Right knee pain.  EXAM: RIGHT KNEE - COMPLETE 4+ VIEW  COMPARISON:  None.  FINDINGS: There is no acute bony or joint abnormality. Advanced tricompartmental osteoarthritis is noted. Trace joint effusion is present.  IMPRESSION: No acute finding.  Advanced tricompartmental degenerative change.   Electronically Signed   By: Drusilla Kanner M.D.   On: 03/08/2014 01:25    Scheduled Meds: . docusate sodium  100 mg Oral BID  . enoxaparin (LOVENOX) injection  40 mg Subcutaneous Q24H  . glimepiride  2 mg Oral Q breakfast  . insulin aspart  0-9 Units Subcutaneous TID WC  . pantoprazole  80 mg Oral Daily  . senna  1 tablet Oral BID  . simvastatin  40 mg Oral q1800   Continuous Infusions: . 0.45 % NaCl with KCl 20 mEq / L 75 mL/hr (03/09/14 0952)  . lactated ringers 50 mL/hr at 03/08/14 1200   Antibiotics Given (last 72 hours)   Date/Time Action Medication Dose Rate   03/08/14 1312 Given  [test dose given. no reaction noted.]   [MAR Hold] ceFAZolin (ANCEF) IVPB 2 g/50 mL premix (On MAR Hold since 03/08/14 1152) 2 g    03/08/14 1902 Given    ceFAZolin (ANCEF) IVPB 2 g/50 mL premix 2 g 100 mL/hr   03/09/14 0145 Given   ceFAZolin (ANCEF) IVPB 2 g/50 mL premix 2 g 100 mL/hr      Principal Problem:   Fracture of femoral neck, right Active Problems:   Diabetes mellitus   Leucocytosis   Elevated blood pressure    Time spent: 35 min    Albaraa Swingle  [facility] [phone] If 7PM-7AM, please contact night-coverage at [email or web address], password [facility] 03/09/2014, 11:07 AM  LOS: 2 days

## 2014-03-09 NOTE — Evaluation (Signed)
Physical Therapy Evaluation Patient Details Name: [name] MRN: [id number] DOB: 19-Apr-1927 Today's Date: 03/09/2014 Time: 4098-1191 PT Time Calculation (min): 30 min  PT Assessment / Plan / Recommendation History of Present Illness  s/p ARTHROPLASTY BIPOLAR HIP; RIGHT (Right)  Clinical Impression  Patient is s/p right bipolar hip arthroplasty surgery resulting in functional limitations due to the deficits listed below (see PT Problem List). Patient will benefit from skilled PT to increase their independence and safety with mobility to allow discharge to the venue listed below. Family friend was in room to assist with providing information who was speaking with pt daughter.      PT Assessment  Patient needs continued PT services    Follow Up Recommendations  SNF;Supervision/Assistance - 24 hour    Does the patient have the potential to tolerate intense rehabilitation      Barriers to Discharge        Equipment Recommendations  3in1 (PT)    Recommendations for Other Services OT consult   Frequency Min 5X/week (Possible d/c to home)    Precautions / Restrictions Precautions Precautions: Posterior Hip;Fall Precaution Booklet Issued: Yes (comment) Precaution Comments: Booklet issued and reviewed Restrictions Weight Bearing Restrictions: Yes RLE Weight Bearing: Weight bearing as tolerated   Pertinent Vitals/Pain 5/10 pain Nurse aware and was in room when PT entered Pt repositioned upright in chair for comfort      Mobility  Bed Mobility Overal bed mobility: +2 for physical assistance;Needs Assistance Bed Mobility: Supine to Sit Supine to sit: Mod assist;+2 for physical assistance;HOB elevated General bed mobility comments: pt mod assist for supine>sit, needs physical assist to scoot hips forward with bed pad. 2nd person to assist with trunk control Transfers Overall transfer level: Needs assistance Equipment used: Rolling walker (2 wheeled) Transfers: Sit to/from  [employer] Sit to Stand: Mod assist;+2 safety/equipment Stand pivot transfers: Mod assist;+2 safety/equipment General transfer comment: Pt requires Mod assist for sit>stand today, physical assist to bring into standing position, verbal cues for hand and foot placement 2nd person for safety. Stand pivot transfer requires Mod assist for RW control and to advance RLE. Verbal cues for sequencing.    Exercises General Exercises - Lower Extremity Ankle Circles/Pumps: AROM;Both;10 reps;Seated Quad Sets: AROM;Right;10 reps;Seated Gluteal Sets: AROM;Both;10 reps;Seated   PT Diagnosis: Difficulty walking;Abnormality of gait;Generalized weakness;Acute pain  PT Problem List: Decreased strength;Decreased range of motion;Decreased activity tolerance;Decreased balance;Decreased mobility;Decreased knowledge of use of DME;Decreased safety awareness;Decreased knowledge of precautions;Pain PT Treatment Interventions: DME instruction;Gait training;Stair training;Functional mobility training;Therapeutic activities;Therapeutic exercise;Balance training;Neuromuscular re-education;Patient/family education;Modalities     PT Goals(Current goals can be found in the care plan section) Acute Rehab PT Goals Patient Stated Goal: Go home PT Goal Formulation: With patient Time For Goal Achievement: 03/16/14 Potential to Achieve Goals: Good  Visit Information  Last PT Received On: 03/09/14 Assistance Needed: +2 History of Present Illness: s/p ARTHROPLASTY BIPOLAR HIP; RIGHT (Right)       Prior Functioning  Home Living Family/patient expects to be discharged to:: Skilled nursing facility (Possibly stay at a family friend's residence) Living Arrangements: Spouse/significant other;Other (Comment) (Caregiver 2x/week for several hours) Available Help at Discharge: Friend(s);Available 24 hours/day;Family Type of Home: House Home Access: Stairs to enter [employer] of Steps: 3 Entrance  Stairs-Rails: Right;Left Home Layout: Multi-level;Able to live on main level with bedroom/bathroom Alternate Level Stairs-Number of Steps: 4 Home Equipment: Walker - 2 wheels;Tub bench;Shower seat Prior Function Level of Independence: Independent Communication Communication: No difficulties Dominant Hand: Right    Cognition  Cognition Arousal/Alertness: Awake/alert Behavior During Therapy: WFL for tasks assessed/performed Overall Cognitive Status: Within Functional Limits for tasks assessed    Extremity/Trunk Assessment Upper Extremity Assessment Upper Extremity Assessment: Overall WFL for tasks assessed Lower Extremity Assessment Lower Extremity Assessment: RLE deficits/detail RLE Deficits / Details: Decreased hip flexion strength RLE: Unable to fully assess due to pain   Balance Balance Overall balance assessment: Needs assistance Sitting-balance support: Bilateral upper extremity supported Sitting balance-Leahy Scale: Poor Sitting balance - Comments: Sits EOB with Min assist Standing balance support: Bilateral upper extremity supported Standing balance-Leahy Scale: Poor Standing balance comment: Pt requires bilateral UE support to stand General Comments General comments (skin integrity, edema, etc.): Pt with urinary incontinence upon standing  End of Session PT - End of Session Equipment Utilized During Treatment: Gait belt;Oxygen Activity Tolerance: Patient limited by pain Patient left: in chair;with call bell/phone within reach;with family/visitor present Nurse Communication: Mobility status  GP    [facility] Secor Oconto[place]bour, [place] CarolinaPT [phone]  Berton MountBarbour, Aizley Stenseth S 03/09/2014, 11:25 AM

## 2014-03-09 NOTE — Progress Notes (Signed)
Clinical Social Work Department  BRIEF PSYCHOSOCIAL ASSESSMENT  Patient: [name] Account Number: [id number]  Admit date: 03/07/14 Clinical Social Worker Rhea Pink, MSW Date/Time: 03/09/2014 3:00 PM Referred by: Physician Date Referred:  Referred for   SNF Placement   Other Referral:  Interview type: Patient  Other interview type: PSYCHOSOCIAL DATA  Living Status:Husband Admitted from facility:  Level of care:  Primary support name: Noel Gerold Primary support relationship to patient: Daughter Degree of support available:  Strong and vested  CURRENT CONCERNS  Current Concerns   Post-Acute Placement   Other Concerns:  SOCIAL WORK ASSESSMENT / PLAN  CSW met with pt re: PT recommendation for SNF.   Pt lives with  Her spouse  CSW explained placement process and answered questions.   Pt reports [facility]  as her preference    CSW completed FL2 and initiated SNF search.     Assessment/plan status: Information/Referral to [employer]  Other assessment/ plan:  Information/referral to community resources:  SNF     PATIENT'S/FAMILY'S RESPONSE TO PLAN OF CARE:  Pt  reports she is agreeable to [facility] SNF in order to increase strength and independence with mobility prior to returning home  Pt verbalized understanding of placement process and appreciation for CSW assist.   Rhea Pink, MSW, [place]

## 2014-03-10 ENCOUNTER — Inpatient Hospital Stay (HOSPITAL_COMMUNITY): Payer: Medicare Other

## 2014-03-10 DIAGNOSIS — S72009A Fracture of unspecified part of neck of unspecified femur, initial encounter for closed fracture: Secondary | ICD-10-CM

## 2014-03-10 LAB — GLUCOSE, CAPILLARY
GLUCOSE-CAPILLARY: 170 mg/dL — AB (ref 70–99)
Glucose-Capillary: 195 mg/dL — ABNORMAL HIGH (ref 70–99)
Glucose-Capillary: 207 mg/dL — ABNORMAL HIGH (ref 70–99)
Glucose-Capillary: 172 mg/dL — ABNORMAL HIGH (ref 70–99)

## 2014-03-10 LAB — CBC
HEMATOCRIT: 29.2 % — AB (ref 36.0–46.0)
HEMOGLOBIN: 9.9 g/dL — AB (ref 12.0–15.0)
MCH: 32.7 pg (ref 26.0–34.0)
MCHC: 33.9 g/dL (ref 30.0–36.0)
MCV: 96.4 fL (ref 78.0–100.0)
Platelets: 200 10*3/uL (ref 150–400)
RBC: 3.03 MIL/uL — AB (ref 3.87–5.11)
RDW: 13.2 % (ref 11.5–15.5)
WBC: 17.5 10*3/uL — ABNORMAL HIGH (ref 4.0–10.5)

## 2014-03-10 LAB — BASIC METABOLIC PANEL
BUN: 11 mg/dL (ref 6–23)
CO2: 23 mEq/L (ref 19–32)
Calcium: 9 mg/dL (ref 8.4–10.5)
Chloride: 98 mEq/L (ref 96–112)
Creatinine, Ser: 0.67 mg/dL (ref 0.50–1.10)
GFR calc Af Amer: >90 mL/min (ref >90)
GFR, EST NON AFRICAN AMERICAN: 79 mL/min — AB (ref >90)
GLUCOSE: 206 mg/dL — AB (ref 70–99)
POTASSIUM: 3.8 meq/L (ref 3.7–5.3)
Sodium: 136 mEq/L — ABNORMAL LOW (ref 137–147)

## 2014-03-10 NOTE — Care Management Note (Signed)
CARE MANAGEMENT NOTE 03/10/2014  Patient:  [name],[name]   Account Number:  [id number]  Date Initiated:  03/08/2014  Documentation initiated by:  Vance PeperBRADY,Cleota Pellerito  Subjective/Objective Assessment:   80 yr old female s/p fall.  s/p right hip arthroplasty.     Action/Plan:   [facility] Social worker is aware.   Anticipated DC Date:  03/11/2014   Anticipated DC Plan:  SKILLED NURSING FACILITY  In-house referral  Clinical Social Worker      DC Planning Services  CM consult      [facility]  NA   Choice offered to / List presented to:             Status of service:  Completed, signed off Medicare Important Message given?   (If response is "NO", the following Medicare IM given date fields will be blank) Date Medicare IM given:   Date Additional Medicare IM given:    Discharge Disposition:  SKILLED NURSING FACILITY

## 2014-03-10 NOTE — Progress Notes (Signed)
Occupational Therapy Evaluation Patient Details Name: [name] MRN: [id number] DOB: 05/02/1927 Today's Date: 03/10/2014 Time: [id number] OT Time Calculation (min): 22 min  OT Assessment / Plan / Recommendation History of present illness s/p ARTHROPLASTY BIPOLAR HIP; RIGHT (Right)   Clinical Impression   PTA pt lived at home with her husband and was in [place] to visit her daughter when she fell. She was independent in ADLs and mobility prior to her fall. Pt declined to get out of bed for evaluation, so limited evaluation was completed to assess potential for ADL tasks. Pt limited in ADLs by fatigue, pain, decreased strength, and decreased ROM. CIR consult has been placed by MD and pt states she is hoping to be d/c to CIR. Pt would benefit from acute OT to address ADL concerns and further assess potential for ADL tasks.     OT Assessment  Patient needs continued OT Services    Follow Up Recommendations  CIR;Supervision/Assistance - 24 hour       Equipment Recommendations  None recommended by OT       Frequency  Min 2X/week    Precautions / Restrictions Precautions Precautions: Posterior Hip;Fall Restrictions Weight Bearing Restrictions: Yes RLE Weight Bearing: Weight bearing as tolerated   Pertinent Vitals/Pain Pt c/o pain but did not provide pain number. Repositioned pt in bed and RN notified.     ADL  Eating/Feeding: Independent Where Assessed - Eating/Feeding: Chair Grooming: Set up;Supervision/safety Where Assessed - Grooming: Supported sitting Upper Body Bathing: Set up;Supervision/safety Where Assessed - Upper Body Bathing: Supported sitting Upper Body Dressing: Supervision/safety;Set up Where Assessed - Upper Body Dressing: Supported sitting    OT Diagnosis: Generalized weakness;Acute pain  OT Problem List: Decreased strength;Decreased range of motion;Decreased activity tolerance;Impaired balance (sitting and/or standing);Decreased safety awareness;Decreased  knowledge of use of DME or AE;Decreased knowledge of precautions;Pain OT Treatment Interventions: Self-care/ADL training;Therapeutic exercise;Energy conservation;DME and/or AE instruction;Therapeutic activities;Patient/family education;Balance training   OT Goals(Current goals can be found in the care plan section) Acute Rehab OT Goals Patient Stated Goal: Go home OT Goal Formulation: With patient Time For Goal Achievement: 03/17/14 Potential to Achieve Goals: Good  Visit Information  Last OT Received On: 03/10/14 Assistance Needed: +1 ((supine in bed for duration)) History of Present Illness: s/p ARTHROPLASTY BIPOLAR HIP; RIGHT (Right)       Prior Functioning     Home Living Family/patient expects to be discharged to:: Inpatient rehab Living Arrangements: Spouse/significant other;Other (Comment) Available Help at Discharge: Friend(s);Available 24 hours/day;Family Type of Home: House Home Access: Stairs to enter [employer] Stairs-Number of Steps: 3 Entrance Stairs-Rails: Right;Left Home Layout: Multi-level;Able to live on main level with bedroom/bathroom Alternate Level Stairs-Number of Steps: 4 Home Equipment: Walker - 2 wheels;Tub bench;Shower seat Prior Function Level of Independence: Independent Communication Communication: No difficulties Dominant Hand: Right         Vision/Perception Vision - History Patient Visual Report: No change from baseline   Cognition  Cognition Arousal/Alertness: Awake/alert Behavior During Therapy: WFL for tasks assessed/performed Overall Cognitive Status: Within Functional Limits for tasks assessed    Extremity/Trunk Assessment Upper Extremity Assessment Upper Extremity Assessment: Overall WFL for tasks assessed Lower Extremity Assessment Lower Extremity Assessment: Defer to PT evaluation              End of Session OT - End of Session Activity Tolerance: Patient limited by fatigue;Patient limited by pain Patient left: in  bed;with call bell/phone within reach       Rae LipsMiller, Markitta Ausburn M [phone] 03/10/2014, 2:51 PM

## 2014-03-10 NOTE — Progress Notes (Signed)
Patient ID: Maggie FontFrances Dinneen, female   DOB: 09/08/1927, 80 y.o.   MRN: [id number]     Subjective:  Patient reports pain as mild.  Patient denies any cp or sob.  Looks better today.  Objective:   VITALS:   Filed Vitals:   03/10/14 0500 03/10/14 0656 03/10/14 0705 03/10/14 0710  BP: 110/50     Pulse: 91     Temp: 99 F (37.2 C)     TempSrc:      Resp: 16     Height:      Weight:      SpO2: 93% 90% 86% 93%    ABD soft Sensation intact distally Dorsiflexion/Plantar flexion intact Incision: dressing C/D/I and no drainage Negative log roll Wound clean and dry no sign of infection Dry dressing applied  Lab Results  Component Value Date   WBC 17.6* 03/09/2014   HGB 10.8* 03/09/2014   HCT 32.0* 03/09/2014   MCV 96.1 03/09/2014   PLT 207 03/09/2014     Assessment/Plan: 2 Days Post-Op   Principal Problem:   Fracture of femoral neck, right Active Problems:   Diabetes mellitus   Leucocytosis   Elevated blood pressure   Advance diet Up with therapy O2 low on room air continue nasal cannula Encourage deep breathing WBAT Dry dressing PRN SNF fri   DOUGLAS PARRY, BRANDON 03/10/2014, 7:19 AM   Seen and agree with above.  Teryl LucyJoshua Prem Coykendall, MD Cell [phone]

## 2014-03-10 NOTE — Consult Note (Signed)
Physical Medicine and Rehabilitation Consult Reason for Consult: Right hip fracture Referring Physician: [facility]    HPI: [name] is a 80 y.o. right-handed female with history of diabetes mellitus with peripheral neuropathy. Patient independent prior to admission living with her husband in [place] and was visiting with plans to move to [place] to be closer to family. Admitted 03/08/2014 after a fall when she tripped over her. landing on her right hip without loss of consciousness. X-rays and imaging revealed displaced right femoral neck fracture. Underwent right arthroplasty bipolar hip 03/08/2014 per Dr. Dion Saucier. Weightbearing as tolerated right lower extremity with posterior hip precautions. Postoperative pain management. Placed on Lovenox for DVT prophylaxis. Acute blood loss anemia 9.9 and monitored.  Pleasant, visiting with friend Review of Systems  Gastrointestinal: Positive for constipation.       GERD  Musculoskeletal: Positive for myalgias.  All other systems reviewed and are negative.   Past Medical History  Diagnosis Date  . Diabetes mellitus without complication   . Hyperlipidemia   . Sleep apnea   . Psoriasis   . Fracture of femoral neck, right 03/08/2014   Past Surgical History  Procedure Laterality Date  . Knee surgry    . Breast biopsy     Family History  Problem Relation Age of Onset  . CAD Neg Hx   . Diabetes Mellitus II Neg Hx    Social History:  reports that she has never smoked. She has never used smokeless tobacco. She reports that she does not drink alcohol or use illicit drugs. Allergies:  Allergies  Allergen Reactions  . Penicillins Other (See Comments)    Unknown; tolerates Ancef   Medications Prior to Admission  Medication Sig Dispense Refill  . clobetasol cream (TEMOVATE) 0.05 % Apply 1 application topically daily as needed (rash).      Marland Kitchen glimepiride (AMARYL) 2 MG tablet Take 2 mg by mouth daily with breakfast.      .  metFORMIN (GLUCOPHAGE-XR) 500 MG 24 hr tablet Take 500 mg by mouth 2 (two) times daily.      Marland Kitchen omeprazole (PRILOSEC) 20 MG capsule Take 20 mg by mouth daily.      . simvastatin (ZOCOR) 40 MG tablet Take 40 mg by mouth daily.        Home: Home Living Family/patient expects to be discharged to:: Skilled nursing facility (Possibly stay at a family friend's residence) Living Arrangements: Spouse/significant other;Other (Comment) (Caregiver 2x/week for several hours) Available Help at Discharge: Friend(s);Available 24 hours/day;Family Type of Home: House Home Access: Stairs to enter [employer] of Steps: 3 Entrance Stairs-Rails: Right;Left Home Layout: Multi-level;Able to live on main level with bedroom/bathroom Alternate Level Stairs-Number of Steps: 4 Home Equipment: Walker - 2 wheels;Tub bench;Shower seat  Functional History:   Functional Status:  Mobility:          ADL:    Cognition: Cognition Overall Cognitive Status: Within Functional Limits for tasks assessed Orientation Level: Oriented X4 Cognition Arousal/Alertness: Awake/alert Behavior During Therapy: WFL for tasks assessed/performed Overall Cognitive Status: Within Functional Limits for tasks assessed  Blood pressure 110/50, pulse 91, temperature 99 F (37.2 C), temperature source Oral, resp. rate 16, height 5\' 3"  (1.6 m), weight 73.483 kg (162 lb), SpO2 93.00%. Physical Exam  Vitals reviewed. Constitutional: She is oriented to person, place, and time.  HENT:  Head: Normocephalic.  Eyes: EOM are normal.  Neck: Normal range of motion. Neck supple. No thyromegaly present.  Cardiovascular: Normal rate and regular rhythm.  Respiratory: Effort normal and breath sounds normal. No respiratory distress.  GI: Soft. Bowel sounds are normal. She exhibits no distension.  Neurological: She is alert and oriented to person, place, and time.  Follows full commands  Skin:  Hip incision clean and And  appropriately tender  Psychiatric: She has a normal mood and affect.  Motor 5/5 in BUE 4/5 LLE 2-/5 R HF, KE, 4/5 R ADF Sensation intact LT in BLE Calves soft non tender  Results for orders placed during the hospital encounter of 03/07/14 (from the past 24 hour(s))  GLUCOSE, CAPILLARY     Status: Abnormal   Collection Time    03/09/14  4:54 PM      Result Value Ref Range   Glucose-Capillary 156 (*) 70 - 99 mg/dL  GLUCOSE, CAPILLARY     Status: Abnormal   Collection Time    03/09/14  9:34 PM      Result Value Ref Range   Glucose-Capillary 159 (*) 70 - 99 mg/dL  GLUCOSE, CAPILLARY     Status: Abnormal   Collection Time    03/10/14  6:33 AM      Result Value Ref Range   Glucose-Capillary 195 (*) 70 - 99 mg/dL  CBC     Status: Abnormal   Collection Time    03/10/14  6:55 AM      Result Value Ref Range   WBC 17.5 (*) 4.0 - 10.5 K/uL   RBC 3.03 (*) 3.87 - 5.11 MIL/uL   Hemoglobin 9.9 (*) 12.0 - 15.0 g/dL   HCT 16.1 (*) 09.6 - 04.5 %   MCV 96.4  78.0 - 100.0 fL   MCH 32.7  26.0 - 34.0 pg   MCHC 33.9  30.0 - 36.0 g/dL   RDW 40.9  81.1 - 91.4 %   Platelets 200  150 - 400 K/uL  BASIC METABOLIC PANEL     Status: Abnormal   Collection Time    03/10/14  6:55 AM      Result Value Ref Range   Sodium 136 (*) 137 - 147 mEq/L   Potassium 3.8  3.7 - 5.3 mEq/L   Chloride 98  96 - 112 mEq/L   CO2 23  19 - 32 mEq/L   Glucose, Bld 206 (*) 70 - 99 mg/dL   BUN 11  6 - 23 mg/dL   Creatinine, Ser 7.82  0.50 - 1.10 mg/dL   Calcium 9.0  8.4 - 95.6 mg/dL   GFR calc non Af Amer 79 (*) >90 mL/min   GFR calc Af Amer >90  >90 mL/min   Dg Pelvis Portable  03/08/2014   CLINICAL DATA:  Post right hip arthroplasty  EXAM: PORTABLE PELVIS 1-2 VIEWS; PORTABLE RIGHT HIP - 1 VIEW  COMPARISON:  Preoperative radiographs 03/08/2014  FINDINGS: Interval surgical changes of right total hip arthroplasty. No evidence of hardware complication. The visualized bony pelvis is intact. Degenerative change again noted at  the pubic symphysis. The femoral head prosthesis is located on the cross-table lateral view.  IMPRESSION: Right total hip arthroplasty repair of transcervical femoral neck fracture. No evidence of immediate hardware complication.   Electronically Signed   By: Malachy Moan M.D.   On: 03/08/2014 17:47   Dg Hip Portable 1 View Right  03/08/2014   CLINICAL DATA:  Post right hip arthroplasty  EXAM: PORTABLE PELVIS 1-2 VIEWS; PORTABLE RIGHT HIP - 1 VIEW  COMPARISON:  Preoperative radiographs 03/08/2014  FINDINGS: Interval surgical changes of right total hip arthroplasty.  No evidence of hardware complication. The visualized bony pelvis is intact. Degenerative change again noted at the pubic symphysis. The femoral head prosthesis is located on the cross-table lateral view.  IMPRESSION: Right total hip arthroplasty repair of transcervical femoral neck fracture. No evidence of immediate hardware complication.   Electronically Signed   By: Malachy MoanHeath  McCullough M.D.   On: 03/08/2014 17:47    Assessment/Plan: Diagnosis: R FNF s/p Hemiarthroplasty POD#2 1. Does the need for close, 24 hr/day medical supervision in concert with the patient's rehab needs make it unreasonable for this patient to be served in a less intensive setting? Yes 2. Co-Morbidities requiring supervision/potential complications: DM, post op pain 3. Due to bladder management, bowel management, safety, skin/wound care, disease management, medication administration, pain management and patient education, does the patient require 24 hr/day rehab nursing? Yes 4. Does the patient require coordinated care of a physician, rehab nurse, PT (1-2 hrs/day, 5 days/week) and OT (1-2 hrs/day, 5 days/week) to address physical and functional deficits in the context of the above medical diagnosis(es)? Yes Addressing deficits in the following areas: balance, endurance, locomotion, strength, transferring, bathing, dressing, feeding, grooming and toileting 5. Can the  patient actively participate in an intensive therapy program of at least 3 hrs of therapy per day at least 5 days per week? Yes 6. The potential for patient to make measurable gains while on inpatient rehab is excellent 7. Anticipated functional outcomes upon discharge from inpatient rehab are Mod I with PT, Mod I with OT, NA with SLP. 8. Estimated rehab length of stay to reach the above functional goals is: 7-10 days 9. Does the patient have adequate social supports to accommodate these discharge functional goals? Potentially 10. Anticipated D/C setting: Home 11. Anticipated post D/C treatments: HH therapy 12. Overall Rehab/Functional Prognosis: excellent  RECOMMENDATIONS: This patient's condition is appropriate for continued rehabilitative care in the following setting: CIR Patient has agreed to participate in recommended program. Yes Note that insurance prior authorization may be required for reimbursement for recommended care.  Comment:     03/10/2014

## 2014-03-10 NOTE — Progress Notes (Signed)
PROGRESS NOTE  [name] AVW:[id number] DOB: 09/04/1927 DOA: 03/07/2014 PCP: Pcp Not In System  Assessment/Plan: Right hip fracture status post mechanical fall -  S/p surgery pain relief medications.  SCD for DVT prophylaxis for now until surgery.  Diabetes mellitus type 2 - ssi  Elevated blood pressure - probably secondary to pain. Continue pain relief medications. Patient has been placed on IV hydralazine when necessary for systolic blood pressure more than 160.   Leukocytosis - probably reactionary. Patient is afebrile. UA is negative.  -recheck x ray as 1st was 1 view- get 2 view- wean off O2 as tolerated -patient not from [facility] so do not have base labs  History of OSA presently not using CPAP  ABLA- monitor and transfuse as necc  Code Status: full Family Communication: child at bedside Disposition Plan: SNF   Consultants:  ortho  Procedures:    Antibiotics:    HPI/Subjective: +BM Pain controlled   Objective: Filed Vitals:   03/10/14 0500  BP: 110/50  Pulse: 91  Temp: 99 F (37.2 C)  Resp: 16    Intake/Output Summary (Last 24 hours) at 03/10/14 0951 Last data filed at 03/10/14 0700  Gross per 24 hour  Intake    540 ml  Output      0 ml  Net    540 ml   Filed Weights   03/08/14 1300  Weight: 73.483 kg (162 lb)    Exam:  General: Well-developed and nourished.  Neck: No mass felt.  Cardiovascular: S1-S2 heard.  Respiratory: No rhonchi or crepitations.  Abdomen: Soft nontender bowel sounds present.  Skin: No rash.  Neurologic: Alert awake oriented to time place and person. Moves all extremities.   Data Reviewed: Basic Metabolic Panel:  Recent Labs Lab 03/08/14 0132 03/08/14 0730 03/09/14 0540 03/10/14 0655  NA 138 140 137 136*  K 4.0 4.2 4.2 3.8  CL 98 100 98 98  CO2 22 20 23 23   GLUCOSE 245* 229* 225* 206*  BUN 17 16 11 11   CREATININE 1.04 0.90 0.76 0.67  CALCIUM 9.6 9.7 8.8 9.0   Liver Function Tests:  Recent  Labs Lab 03/08/14 0132 03/08/14 0730  AST 24 23  ALT 20 21  ALKPHOS 71 74  BILITOT 0.3 0.4  PROT 7.3 7.5  ALBUMIN 3.8 3.8   No results found for this basename: LIPASE, AMYLASE,  in the last 168 hours No results found for this basename: AMMONIA,  in the last 168 hours CBC:  Recent Labs Lab 03/08/14 0132 03/08/14 0730 03/09/14 0540 03/10/14 0655  WBC 17.2* 18.9* 17.6* 17.5*  NEUTROABS 15.0* 16.1*  --   --   HGB 13.4 13.3 10.8* 9.9*  HCT 39.0 39.3 32.0* 29.2*  MCV 95.4 95.6 96.1 96.4  PLT 260 258 207 200   Cardiac Enzymes: No results found for this basename: CKTOTAL, CKMB, CKMBINDEX, TROPONINI,  in the last 168 hours BNP (last 3 results) No results found for this basename: PROBNP,  in the last 8760 hours CBG:  Recent Labs Lab 03/09/14 0658 03/09/14 1106 03/09/14 1654 03/09/14 2134 03/10/14 0633  GLUCAP 249* 204* 156* 159* 195*    Recent Results (from the past 240 hour(s))  URINE CULTURE     Status: None   Collection Time    03/08/14  4:35 AM      Result Value Ref Range Status   Specimen Description URINE, CLEAN CATCH   Final   Special Requests NONE   Final   Culture  Setup  Time     Final   Value: 03/08/2014 06:12     Performed at [facility]   Colony Count     Final   Value: 35,000 COLONIES/ML     Performed at [facility]   Culture     Final   Value: Multiple bacterial morphotypes present, none predominant. Suggest appropriate recollection if clinically indicated.     Performed at [facility]   Report Status 03/08/2014 FINAL   Final  SURGICAL PCR SCREEN     Status: None   Collection Time    03/08/14  8:20 AM      Result Value Ref Range Status   MRSA, PCR NEGATIVE  NEGATIVE Final   Staphylococcus aureus NEGATIVE  NEGATIVE Final   Comment:            The Xpert SA Assay (FDA     approved for NASAL specimens     in patients over 78 years of age),     is one component of     a comprehensive surveillance     program.  Test  performance has     been validated by [facility] for patients greater     than or equal to 78 year old.     It is not intended     to diagnose infection nor to     guide or monitor treatment.     Studies: Dg Pelvis Portable  03/08/2014   CLINICAL DATA:  Post right hip arthroplasty  EXAM: PORTABLE PELVIS 1-2 VIEWS; PORTABLE RIGHT HIP - 1 VIEW  COMPARISON:  Preoperative radiographs 03/08/2014  FINDINGS: Interval surgical changes of right total hip arthroplasty. No evidence of hardware complication. The visualized bony pelvis is intact. Degenerative change again noted at the pubic symphysis. The femoral head prosthesis is located on the cross-table lateral view.  IMPRESSION: Right total hip arthroplasty repair of transcervical femoral neck fracture. No evidence of immediate hardware complication.   Electronically Signed   By: Malachy MoanHeath  McCullough M.D.   On: 03/08/2014 17:47   Dg Hip Portable 1 View Right  03/08/2014   CLINICAL DATA:  Post right hip arthroplasty  EXAM: PORTABLE PELVIS 1-2 VIEWS; PORTABLE RIGHT HIP - 1 VIEW  COMPARISON:  Preoperative radiographs 03/08/2014  FINDINGS: Interval surgical changes of right total hip arthroplasty. No evidence of hardware complication. The visualized bony pelvis is intact. Degenerative change again noted at the pubic symphysis. The femoral head prosthesis is located on the cross-table lateral view.  IMPRESSION: Right total hip arthroplasty repair of transcervical femoral neck fracture. No evidence of immediate hardware complication.   Electronically Signed   By: Malachy MoanHeath  McCullough M.D.   On: 03/08/2014 17:47    Scheduled Meds: . docusate sodium  100 mg Oral BID  . enoxaparin (LOVENOX) injection  40 mg Subcutaneous Q24H  . glimepiride  2 mg Oral Q breakfast  . insulin aspart  0-9 Units Subcutaneous TID WC  . pantoprazole  80 mg Oral Daily  . senna  1 tablet Oral BID  . simvastatin  40 mg Oral q1800   Continuous Infusions: . lactated ringers 50 mL/hr  at 03/08/14 1200   Antibiotics Given (last 72 hours)   Date/Time Action Medication Dose Rate   03/08/14 1312 Given  [test dose given. no reaction noted.]   [MAR Hold] ceFAZolin (ANCEF) IVPB 2 g/50 mL premix (On MAR Hold since 03/08/14 1152) 2 g    03/08/14 1902 Given  ceFAZolin (ANCEF) IVPB 2 g/50 mL premix 2 g 100 mL/hr   03/09/14 0145 Given   ceFAZolin (ANCEF) IVPB 2 g/50 mL premix 2 g 100 mL/hr      Principal Problem:   Fracture of femoral neck, right Active Problems:   Diabetes mellitus   Leucocytosis   Elevated blood pressure    Time spent: 25 min    Khrystina Bonnes  [facility] [phone] If 7PM-7AM, please contact night-coverage at [email or web address], password [facility] 03/10/2014, 9:51 AM  LOS: 3 days

## 2014-03-10 NOTE — Progress Notes (Addendum)
Physical Therapy Treatment Patient Details Name: [name] MRN: [id number] DOB: 24-Jul-1927 Today's Date: 03/10/2014 Time: 4098-1191 PT Time Calculation (min): 31 min  PT Assessment / Plan / Recommendation  History of Present Illness s/p ARTHROPLASTY BIPOLAR HIP; RIGHT (Right)   PT Comments   Pt progressing slowly. Able to ambulate with Mod assist today up to 8 feet, and perform stand pivot transfer to [facility]. Pt is highly motivated and family is requesting pt be admitted to CIR now instead of SNF. Dr. Benjamine Mola has already placed order for CIR consult. Pt would benefit from inpatient rehab as she is motivated and has strong family support to help her regain her independent status prior to injury. Acute PT will continue to follow and work on independence with functional mobility.  Follow Up Recommendations  CIR;Supervision/Assistance - 24 hour     Does the patient have the potential to tolerate intense rehabilitation     Barriers to Discharge        Equipment Recommendations  3in1 (PT)    Recommendations for Other Services Rehab consult;OT consult  Frequency Min 5X/week   Progress towards PT Goals Progress towards PT goals: Progressing toward goals  Plan Current plan remains appropriate    Precautions / Restrictions Precautions Precautions: Posterior Hip;Fall Restrictions Weight Bearing Restrictions: Yes RLE Weight Bearing: Weight bearing as tolerated   Pertinent Vitals/Pain 4/10 pain - states nurse recently gave her pain medication and will call if it does not get better Pt repositioned in chair for comfort.     Mobility  Bed Mobility Overal bed mobility: +2 for physical assistance;Needs Assistance Bed Mobility: Supine to Sit Supine to sit: Mod assist;+2 for physical assistance;HOB elevated General bed mobility comments: Mod assist for trunk and leg control with supine>sit. Pt able to reach across chest and use rail to assist. Can sit EOB with contact guard assist but needs Mod  assist at first due to pain and posterior lean. Transfers Overall transfer level: Needs assistance Equipment used: Rolling walker (2 wheeled) Transfers: Sit to/from [employer] Sit to Stand: Mod assist;+2 safety/equipment Stand pivot transfers: Mod assist;+2 safety/equipment General transfer comment: Stand pivot transfer to bedside toilet, pt incontinent before reaching commode. Needs physical assist to come to standing position and frequent cues for hand placement and upright posture. Able to bear weight through RLE pt states "55 Lbs" of weight through operated leg. Ambulation/Gait Ambulation/Gait assistance: Mod assist Ambulation Distance (Feet): 8 Feet Assistive device: Rolling walker (2 wheeled) Gait Pattern/deviations: Step-to pattern;Decreased step length - right;Decreased step length - left;Decreased stance time - right;Antalgic;Trunk flexed;Narrow base of support General Gait Details: Pt ambulates up to 8 feet, mod assist for RW control with frequent verbal cues for sequencing. Pt pushes RW too far anteriorly and needs to be constantly corrected. Pt able to take bigger steps towards end of distance with cues. Second person for safety and equipment.    Exercises General Exercises - Lower Extremity Ankle Circles/Pumps: AROM;Both;10 reps;Seated Quad Sets: AROM;Right;10 reps;Seated   PT Diagnosis:    PT Problem List:   PT Treatment Interventions:     PT Goals (current goals can now be found in the care plan section) Acute Rehab PT Goals PT Goal Formulation: With patient Time For Goal Achievement: 03/16/14 Potential to Achieve Goals: Good  Visit Information  Last PT Received On: 03/10/14 Assistance Needed: +2 History of Present Illness: s/p ARTHROPLASTY BIPOLAR HIP; RIGHT (Right)    Subjective Data  Subjective: Would like to use BSC to urinate  Cognition  Cognition Arousal/Alertness: Awake/alert Behavior During Therapy: WFL for tasks  assessed/performed Overall Cognitive Status: Within Functional Limits for tasks assessed    Balance  General Comments General comments (skin integrity, edema, etc.): Pt with urinary incontinence upon standing  End of Session PT - End of Session Equipment Utilized During Treatment: Gait belt;Oxygen Activity Tolerance: Patient limited by pain Patient left: in chair;with call bell/phone within reach;with family/visitor present Nurse Communication: Mobility status   GP    [facility] [place], [place] [phone]   [place], [name] 03/10/2014, 12:55 PM   Addendum - Dr. Benjamine MolaVann has already placed CIR consult

## 2014-03-11 ENCOUNTER — Inpatient Hospital Stay (HOSPITAL_COMMUNITY)
Admission: RE | Admit: 2014-03-11 | Discharge: 2014-03-19 | DRG: 945 | Disposition: A | Discharge status: Home / Self Care | Payer: Medicare Other | Source: Intra-hospital | Attending: Physical Medicine & Rehabilitation | Admitting: Physical Medicine & Rehabilitation

## 2014-03-11 ENCOUNTER — Encounter (HOSPITAL_COMMUNITY): Payer: Self-pay | Admitting: Orthopedic Surgery

## 2014-03-11 DIAGNOSIS — R030 Elevated blood-pressure reading, without diagnosis of hypertension: Secondary | ICD-10-CM

## 2014-03-11 DIAGNOSIS — R350 Frequency of micturition: Secondary | ICD-10-CM | POA: Diagnosis present

## 2014-03-11 DIAGNOSIS — S72009A Fracture of unspecified part of neck of unspecified femur, initial encounter for closed fracture: Secondary | ICD-10-CM | POA: Diagnosis present

## 2014-03-11 DIAGNOSIS — Z96649 Presence of unspecified artificial hip joint: Secondary | ICD-10-CM

## 2014-03-11 DIAGNOSIS — G4730 Sleep apnea, unspecified: Secondary | ICD-10-CM | POA: Diagnosis present

## 2014-03-11 DIAGNOSIS — W010XXA Fall on same level from slipping, tripping and stumbling without subsequent striking against object, initial encounter: Secondary | ICD-10-CM

## 2014-03-11 DIAGNOSIS — D62 Acute posthemorrhagic anemia: Secondary | ICD-10-CM | POA: Diagnosis present

## 2014-03-11 DIAGNOSIS — S72009D Fracture of unspecified part of neck of unspecified femur, subsequent encounter for closed fracture with routine healing: Secondary | ICD-10-CM

## 2014-03-11 DIAGNOSIS — J9600 Acute respiratory failure, unspecified whether with hypoxia or hypercapnia: Secondary | ICD-10-CM | POA: Diagnosis present

## 2014-03-11 DIAGNOSIS — E785 Hyperlipidemia, unspecified: Secondary | ICD-10-CM | POA: Diagnosis present

## 2014-03-11 DIAGNOSIS — IMO0001 Reserved for inherently not codable concepts without codable children: Secondary | ICD-10-CM

## 2014-03-11 DIAGNOSIS — Z5189 Encounter for other specified aftercare: Principal | ICD-10-CM

## 2014-03-11 DIAGNOSIS — K219 Gastro-esophageal reflux disease without esophagitis: Secondary | ICD-10-CM | POA: Diagnosis present

## 2014-03-11 DIAGNOSIS — K5900 Constipation, unspecified: Secondary | ICD-10-CM | POA: Diagnosis present

## 2014-03-11 DIAGNOSIS — E1149 Type 2 diabetes mellitus with other diabetic neurological complication: Secondary | ICD-10-CM | POA: Diagnosis present

## 2014-03-11 DIAGNOSIS — E119 Type 2 diabetes mellitus without complications: Secondary | ICD-10-CM

## 2014-03-11 DIAGNOSIS — S72001A Fracture of unspecified part of neck of right femur, initial encounter for closed fracture: Secondary | ICD-10-CM

## 2014-03-11 DIAGNOSIS — E1142 Type 2 diabetes mellitus with diabetic polyneuropathy: Secondary | ICD-10-CM | POA: Diagnosis present

## 2014-03-11 LAB — GLUCOSE, CAPILLARY
GLUCOSE-CAPILLARY: 181 mg/dL — AB (ref 70–99)
Glucose-Capillary: 164 mg/dL — ABNORMAL HIGH (ref 70–99)
Glucose-Capillary: 148 mg/dL — ABNORMAL HIGH (ref 70–99)

## 2014-03-11 LAB — CBC
HCT: 28.4 % — ABNORMAL LOW (ref 36.0–46.0)
Hemoglobin: 9.6 g/dL — ABNORMAL LOW (ref 12.0–15.0)
MCH: 32.5 pg (ref 26.0–34.0)
MCHC: 33.8 g/dL (ref 30.0–36.0)
MCV: 96.3 fL (ref 78.0–100.0)
PLATELETS: 204 10*3/uL (ref 150–400)
RBC: 2.95 MIL/uL — ABNORMAL LOW (ref 3.87–5.11)
RDW: 13.3 % (ref 11.5–15.5)
WBC: 15 10*3/uL — AB (ref 4.0–10.5)

## 2014-03-11 LAB — CREATININE, SERUM
CREATININE: 0.74 mg/dL (ref 0.50–1.10)
GFR calc non Af Amer: 76 mL/min — ABNORMAL LOW (ref >90)
GFR, EST AFRICAN AMERICAN: 88 mL/min — AB (ref >90)

## 2014-03-11 MED ORDER — ONDANSETRON HCL 4 MG PO TABS: 4.0000 mg | ORAL_TABLET | Freq: Four times a day (QID) | ORAL | Status: DC | PRN

## 2014-03-11 MED ORDER — GLIMEPIRIDE 2 MG PO TABS
2.0000 mg | ORAL_TABLET | Freq: Every day | ORAL | Status: DC
  Administered 2014-03-12 – 2014-03-19 (×8): 2 mg via ORAL
  Filled 2014-03-11 (×10): qty 1

## 2014-03-11 MED ORDER — POLYETHYLENE GLYCOL 3350 17 G PO PACK
17.0000 g | PACK | Freq: Every day | ORAL | Status: DC | PRN
  Filled 2014-03-11: qty 1

## 2014-03-11 MED ORDER — ACETAMINOPHEN 325 MG PO TABS
650.0000 mg | ORAL_TABLET | Freq: Four times a day (QID) | ORAL | Status: DC | PRN
  Administered 2014-03-13 – 2014-03-16 (×5): 650 mg via ORAL
  Filled 2014-03-11 (×8): qty 2

## 2014-03-11 MED ORDER — BISACODYL 10 MG RE SUPP: 10.0000 mg | Freq: Every day | RECTAL | Status: DC | PRN

## 2014-03-11 MED ORDER — ENOXAPARIN SODIUM 40 MG/0.4ML ~~LOC~~ SOLN
40.0000 mg | SUBCUTANEOUS | Status: DC
  Administered 2014-03-12 – 2014-03-19 (×8): 40 mg via SUBCUTANEOUS
  Filled 2014-03-11 (×9): qty 0.4

## 2014-03-11 MED ORDER — ONDANSETRON HCL 4 MG/2ML IJ SOLN
4.0000 mg | Freq: Four times a day (QID) | INTRAMUSCULAR | Status: DC | PRN
Start: 2014-03-11 — End: 2014-03-19

## 2014-03-11 MED ORDER — SENNA 8.6 MG PO TABS
1.0000 | ORAL_TABLET | Freq: Two times a day (BID) | ORAL | Status: DC
  Administered 2014-03-11 – 2014-03-19 (×15): 8.6 mg via ORAL
  Filled 2014-03-11 (×21): qty 1

## 2014-03-11 MED ORDER — SORBITOL 70 % SOLN: 30.0000 mL | Freq: Every day | Status: DC | PRN

## 2014-03-11 MED ORDER — SIMVASTATIN 40 MG PO TABS
40.0000 mg | ORAL_TABLET | Freq: Every day | ORAL | Status: DC
  Administered 2014-03-11 – 2014-03-18 (×8): 40 mg via ORAL
  Filled 2014-03-11 (×9): qty 1

## 2014-03-11 MED ORDER — POLYETHYLENE GLYCOL 3350 17 G PO PACK: 17.0000 g | PACK | Freq: Every day | ORAL | Status: DC | PRN

## 2014-03-11 MED ORDER — DSS 100 MG PO CAPS: 100.0000 mg | ORAL_CAPSULE | Freq: Two times a day (BID) | ORAL | Status: DC

## 2014-03-11 MED ORDER — ACETAMINOPHEN 650 MG RE SUPP: 650.0000 mg | Freq: Four times a day (QID) | RECTAL | Status: DC | PRN

## 2014-03-11 MED ORDER — ACETAMINOPHEN 325 MG PO TABS
325.0000 mg | ORAL_TABLET | ORAL | Status: DC | PRN
  Administered 2014-03-16 – 2014-03-18 (×5): 650 mg via ORAL
  Filled 2014-03-11: qty 2

## 2014-03-11 MED ORDER — DOCUSATE SODIUM 100 MG PO CAPS
100.0000 mg | ORAL_CAPSULE | Freq: Two times a day (BID) | ORAL | Status: DC
  Administered 2014-03-11 – 2014-03-19 (×15): 100 mg via ORAL
  Filled 2014-03-11 (×18): qty 1

## 2014-03-11 MED ORDER — METHOCARBAMOL 500 MG PO TABS
500.0000 mg | ORAL_TABLET | Freq: Four times a day (QID) | ORAL | Status: DC | PRN
  Administered 2014-03-11: 500 mg via ORAL
  Filled 2014-03-11: qty 1

## 2014-03-11 MED ORDER — ACETAMINOPHEN 325 MG PO TABS: 650.0000 mg | ORAL_TABLET | Freq: Four times a day (QID) | ORAL | Status: AC | PRN

## 2014-03-11 MED ORDER — HYDROCODONE-ACETAMINOPHEN 5-325 MG PO TABS
1.0000 | ORAL_TABLET | Freq: Four times a day (QID) | ORAL | Status: DC | PRN
  Administered 2014-03-11 – 2014-03-12 (×2): 2 via ORAL
  Administered 2014-03-13 (×2): 1 via ORAL
  Administered 2014-03-14: 2 via ORAL
  Filled 2014-03-11: qty 1
  Filled 2014-03-11 (×2): qty 2
  Filled 2014-03-11: qty 1
  Filled 2014-03-11: qty 2
  Filled 2014-03-11: qty 1

## 2014-03-11 MED ORDER — SENNA 8.6 MG PO TABS: 1.0000 | ORAL_TABLET | Freq: Two times a day (BID) | ORAL | Status: AC

## 2014-03-11 MED ORDER — INSULIN ASPART 100 UNIT/ML ~~LOC~~ SOLN
0.0000 [IU] | Freq: Three times a day (TID) | SUBCUTANEOUS | Status: DC
  Administered 2014-03-11 – 2014-03-12 (×2): 2 [IU] via SUBCUTANEOUS
  Administered 2014-03-12: 3 [IU] via SUBCUTANEOUS
  Administered 2014-03-12 – 2014-03-13 (×3): 2 [IU] via SUBCUTANEOUS
  Administered 2014-03-13: 3 [IU] via SUBCUTANEOUS
  Administered 2014-03-14: 1 [IU] via SUBCUTANEOUS
  Administered 2014-03-14: 2 [IU] via SUBCUTANEOUS
  Administered 2014-03-14 – 2014-03-15 (×3): 1 [IU] via SUBCUTANEOUS
  Administered 2014-03-15: 2 [IU] via SUBCUTANEOUS
  Administered 2014-03-16: 1 [IU] via SUBCUTANEOUS
  Administered 2014-03-17: 2 [IU] via SUBCUTANEOUS
  Administered 2014-03-18: 1 [IU] via SUBCUTANEOUS
  Administered 2014-03-18: 2 [IU] via SUBCUTANEOUS
  Administered 2014-03-19: 1 [IU] via SUBCUTANEOUS

## 2014-03-11 MED ORDER — PANTOPRAZOLE SODIUM 40 MG PO TBEC
80.0000 mg | DELAYED_RELEASE_TABLET | Freq: Every day | ORAL | Status: DC
  Administered 2014-03-12 – 2014-03-19 (×8): 80 mg via ORAL
  Filled 2014-03-11 (×8): qty 2

## 2014-03-11 MED ORDER — ENOXAPARIN SODIUM 40 MG/0.4ML ~~LOC~~ SOLN: 40.0000 mg | SUBCUTANEOUS | Status: DC

## 2014-03-11 NOTE — Progress Notes (Signed)
Physical Therapy Treatment Patient Details Name: Maggie FontFrances Wendell MRN: [id number] DOB: 04/18/1927 Today's Date: 03/11/2014 Time: [id number] PT Time Calculation (min): 18 min  PT Assessment / Plan / Recommendation  History of Present Illness s/p ARTHROPLASTY BIPOLAR HIP; RIGHT (Right)   PT Comments   Pt performed therapeutic exercises with acute PT and tolerated treatment well. She is able to recall 1/3 posterior hip precautions without cues. She is highly motivated and will continue to benefit from skilled PT in CIR to improve independence with functional mobility.   Follow Up Recommendations  CIR;Supervision/Assistance - 24 hour     Does the patient have the potential to tolerate intense rehabilitation     Barriers to Discharge        Equipment Recommendations  3in1 (PT)    Recommendations for Other Services Rehab consult;OT consult  Frequency Min 5X/week   Progress towards PT Goals Progress towards PT goals: Progressing toward goals  Plan Current plan remains appropriate    Precautions / Restrictions Precautions Precautions: Posterior Hip;Fall Restrictions Weight Bearing Restrictions: Yes RLE Weight Bearing: Weight bearing as tolerated   Pertinent Vitals/Pain Pt reports she is not in much pain at the moment - no numerical pain value given Pt repositioned in bed for comfort    Mobility       Exercises Total Joint Exercises Ankle Circles/Pumps: AROM;Both;10 reps;Supine Gluteal Sets: AROM;Both;10 reps;Supine Short Arc Quad: AAROM;Strengthening;Right;10 reps;Supine Heel Slides: AAROM;Right;10 reps;Supine Hip ABduction/ADduction: AAROM;Right;10 reps;Supine Straight Leg Raises: AAROM;Right;10 reps;Supine   PT Diagnosis:    PT Problem List:   PT Treatment Interventions:     PT Goals (current goals can now be found in the care plan section) Acute Rehab PT Goals PT Goal Formulation: With patient Time For Goal Achievement: 03/16/14 Potential to Achieve Goals:  Good  Visit Information  Last PT Received On: 03/11/14 Assistance Needed: +1 History of Present Illness: s/p ARTHROPLASTY BIPOLAR HIP; RIGHT (Right)    Subjective Data  Subjective: Pt willing to perform therapeutic exercises in bed   Cognition  Cognition Arousal/Alertness: Awake/alert Behavior During Therapy: WFL for tasks assessed/performed Overall Cognitive Status: Within Functional Limits for tasks assessed    Balance     End of Session PT - End of Session Equipment Utilized During Treatment: Gait belt Activity Tolerance: Patient limited by pain Patient left: in bed;with call bell/phone within reach Nurse Communication: Mobility status   GP    [facility] [place], [place] [phone]   [place], Glenice Ciccone S 03/11/2014, 2:31 PM

## 2014-03-11 NOTE — Discharge Summary (Signed)
Physician Discharge Summary  [name] QIO:[id number] DOB: 10/08/1927 DOA: 03/07/2014  PCP: Pcp Not In System  Admit date: 03/07/2014 Discharge date: 03/11/2014  Time spent: greater than 30 min  Recommendations for Outpatient Follow-up:  1. Monitor CBG 2. Monitor blood pressure 3. Monitor white blood cell count a week or 2 4. Monitor hemoglobin and weak or 2  Discharge Diagnoses:  Principal Problem:   Fracture of femoral neck, right Active Problems:   Diabetes mellitus, type II, controlled   Leucocytosis, no evidence of infection   Elevated blood pressure Expected postoperative blood loss anemia Obstructive sleep apnea, not on CPAP.  Discharge Condition: Stable to inpatient rehabilitation  Filed Weights   03/08/14 1300  Weight: 73.483 kg (162 lb)    History of present illness:  80 y.o. female with history of diabetes mellitus, OSA had a fall yesterday. She did not hit her head or lose consciousness. In the ER x-rays revealed right hip fracture and on call orthopedic surgeon has been consulted. Patient denies any chest pain palpitations shortness of breath headache nausea vomiting or any focal deficits. Patient states he has history of OSA but has not been using CPAP for over a year. Labs revealed mild leukocytosis but patient is afebrile  Hospital Course:  Patient was admitted to the hospitalist service. Orthopedics consulted. Patient had uneventful right hip hemiarthroplasty. No problems postoperatively. She had a leukocytosis on admission with negative chest x-ray UA and no fevers. Likely stress to margination. Patient has worked with physical therapy occupational therapy. She is tolerating a solid diet and having bowel movements. She will go to inpatient rehabilitation today. She had expected postoperative anemia but no transfusion requirement. Diabetes was well-controlled. Blood pressure occasionally elevated, felt likely related to pain. She is not on antihypertensives.  Please monitor. Patient has a history of obstructive knee, not on CPAP for a year. She has been maintained on nocturnal oxygen by nursing staff which she may continue at rehabilitation. Patient wishes to try CPAP again once discharged.  Procedures:  Right hip hemiarthroplasty on 03/08/2014 by Dr. Dorthula NettlesJosh Landau  Consultations:  Orthopedics  Physical medicine and rehabilitation  Discharge Exam: Filed Vitals:   03/11/14 0740  BP:   Pulse:   Temp:   Resp: 16    General: Alert, oriented. Comfortable. Cardiovascular: Regular rate rhythm without murmurs gallops or rubs Respiratory: Clear to auscultation bilaterally without wheezes rhonchi or rales Abdomen soft nontender nondistended Extremities: Right hip dressing clean dry and intact. No pitting edema.  Discharge Instructions  Discharge Orders   Future Orders Complete By Expires   Diet Carb Modified  As directed    Posterior total hip precautions  As directed    Weight bearing as tolerated  As directed    Questions:     Laterality:     Extremity:         Medication List         acetaminophen 325 MG tablet  Commonly known as:  TYLENOL  Take 2 tablets (650 mg total) by mouth every 6 (six) hours as needed for mild pain (or Fever >/= 101).     bisacodyl 10 MG suppository  Commonly known as:  DULCOLAX  Place 1 suppository (10 mg total) rectally daily as needed for moderate constipation.     clobetasol cream 0.05 %  Commonly known as:  TEMOVATE  Apply 1 application topically daily as needed (rash).     DSS 100 MG Caps  Take 100 mg by mouth 2 (two) times  daily.     enoxaparin 40 MG/0.4ML injection  Commonly known as:  LOVENOX  Inject 0.4 mLs (40 mg total) into the skin daily.     glimepiride 2 MG tablet  Commonly known as:  AMARYL  Take 2 mg by mouth daily with breakfast.     HYDROcodone-acetaminophen 5-325 MG per tablet  Commonly known as:  NORCO  Take 1-2 tablets by mouth every 6 (six) hours as needed. MAXIMUM  TOTAL ACETAMINOPHEN DOSE IS 4000 MG PER DAY     metFORMIN 500 MG 24 hr tablet  Commonly known as:  GLUCOPHAGE-XR  Take 500 mg by mouth 2 (two) times daily.     omeprazole 20 MG capsule  Commonly known as:  PRILOSEC  Take 20 mg by mouth daily.     ondansetron 4 MG tablet  Commonly known as:  ZOFRAN  Take 1 tablet (4 mg total) by mouth every 6 (six) hours as needed for nausea.     polyethylene glycol packet  Commonly known as:  MIRALAX / GLYCOLAX  Take 17 g by mouth daily as needed for mild constipation.     senna 8.6 MG Tabs tablet  Commonly known as:  SENOKOT  Take 1 tablet (8.6 mg total) by mouth 2 (two) times daily.     sennosides-docusate sodium 8.6-50 MG tablet  Commonly known as:  SENOKOT-S  Take 2 tablets by mouth daily.     simvastatin 40 MG tablet  Commonly known as:  ZOCOR  Take 40 mg by mouth daily.       Allergies  Allergen Reactions  . Penicillins Other (See Comments)    Unknown; tolerates Ancef       Follow-up Information   Follow up with Eulas Post, MD. Schedule an appointment as soon as possible for a visit in 2 weeks.   Specialty:  Orthopedic Surgery   Contact information:   [address] [place] [address] [phone]        The results of significant diagnostics from this hospitalization (including imaging, microbiology, ancillary and laboratory) are listed below for reference.    Significant Diagnostic Studies: Dg Chest 1 View  03/08/2014   CLINICAL DATA:  Fall.  EXAM: CHEST - 1 VIEW  COMPARISON:  ADULT CHEST - 2 VIEWS dated 01/05/2012  FINDINGS: Mediastinum and hilar structures normal. Poor inspiration with bibasilar atelectasis. Heart size and pulmonary vascularity stable. No acute bony abnormality identified.  IMPRESSION: Poor inspiration with mild bibasilar atelectasis.   Electronically Signed   By: Maisie Fus  Register   On: 03/08/2014 01:36   Dg Chest 2 View  03/10/2014   CLINICAL DATA:  Leukcoytosis, acute resp  failure  EXAM: CHEST  2 VIEW  COMPARISON:  None.  FINDINGS: Low lung volumes. Cardiac silhouette mild to moderate enlarged. Lungs are clear. No acute osseous abnormalities.  IMPRESSION: No active cardiopulmonary disease.   Electronically Signed   By: Salome Holmes M.D.   On: 03/10/2014 15:46   Dg Lumbar Spine Complete  03/08/2014   CLINICAL DATA:  Fall, back pain.  EXAM: LUMBAR SPINE - COMPLETE 4+ VIEW  COMPARISON:  MRI lumbar spine 10/30/2010.  FINDINGS: There is mild convex left scoliosis. No fracture is identified. Trace anterolisthesis L4 on L5 is due to advanced facet arthropathy. Marked loss of disc space height and endplate spurring are seen at L1-2.  IMPRESSION: No acute finding.  Spondylosis as described.   Electronically Signed   By: Drusilla Kanner M.D.   On: 03/08/2014  01:24   Dg Hip Complete Right  03/08/2014   CLINICAL DATA:  Fall.  Right hip pain.  EXAM: RIGHT HIP - COMPLETE 2+ VIEW  COMPARISON:  None.  FINDINGS: The patient has an acute subcapital fracture of the right hip. No other acute bony or joint abnormality is identified.  IMPRESSION: Acute subcapital fracture right hip.   Electronically Signed   By: Drusilla Kanner M.D.   On: 03/08/2014 01:23   Dg Pelvis Portable  03/08/2014   CLINICAL DATA:  Post right hip arthroplasty  EXAM: PORTABLE PELVIS 1-2 VIEWS; PORTABLE RIGHT HIP - 1 VIEW  COMPARISON:  Preoperative radiographs 03/08/2014  FINDINGS: Interval surgical changes of right total hip arthroplasty. No evidence of hardware complication. The visualized bony pelvis is intact. Degenerative change again noted at the pubic symphysis. The femoral head prosthesis is located on the cross-table lateral view.  IMPRESSION: Right total hip arthroplasty repair of transcervical femoral neck fracture. No evidence of immediate hardware complication.   Electronically Signed   By: Malachy Moan M.D.   On: 03/08/2014 17:47   Dg Hip Portable 1 View Right  03/08/2014   CLINICAL DATA:  Post right  hip arthroplasty  EXAM: PORTABLE PELVIS 1-2 VIEWS; PORTABLE RIGHT HIP - 1 VIEW  COMPARISON:  Preoperative radiographs 03/08/2014  FINDINGS: Interval surgical changes of right total hip arthroplasty. No evidence of hardware complication. The visualized bony pelvis is intact. Degenerative change again noted at the pubic symphysis. The femoral head prosthesis is located on the cross-table lateral view.  IMPRESSION: Right total hip arthroplasty repair of transcervical femoral neck fracture. No evidence of immediate hardware complication.   Electronically Signed   By: Malachy Moan M.D.   On: 03/08/2014 17:47   Dg Knee Complete 4 Views Right  03/08/2014   CLINICAL DATA:  Status post fall.  Right knee pain.  EXAM: RIGHT KNEE - COMPLETE 4+ VIEW  COMPARISON:  None.  FINDINGS: There is no acute bony or joint abnormality. Advanced tricompartmental osteoarthritis is noted. Trace joint effusion is present.  IMPRESSION: No acute finding.  Advanced tricompartmental degenerative change.   Electronically Signed   By: Drusilla Kanner M.D.   On: 03/08/2014 01:25    Microbiology: Recent Results (from the past 240 hour(s))  URINE CULTURE     Status: None   Collection Time    03/08/14  4:35 AM      Result Value Ref Range Status   Specimen Description URINE, CLEAN CATCH   Final   Special Requests NONE   Final   Culture  Setup Time     Final   Value: 03/08/2014 06:12     Performed at [facility] Count     Final   Value: 35,000 COLONIES/ML     Performed at [facility]   Culture     Final   Value: Multiple bacterial morphotypes present, none predominant. Suggest appropriate recollection if clinically indicated.     Performed at [facility]   Report Status 03/08/2014 FINAL   Final  SURGICAL PCR SCREEN     Status: None   Collection Time    03/08/14  8:20 AM      Result Value Ref Range Status   MRSA, PCR NEGATIVE  NEGATIVE Final   Staphylococcus aureus NEGATIVE  NEGATIVE  Final   Comment:            The Xpert SA Assay (FDA     approved for NASAL specimens  in patients over 63 years of age),     is one component of     a comprehensive surveillance     program.  Test performance has     been validated by [employer] for patients greater     than or equal to 47 year old.     It is not intended     to diagnose infection nor to     guide or monitor treatment.     Labs: Basic Metabolic Panel:  Recent Labs Lab 03/08/14 0132 03/08/14 0730 03/09/14 0540 03/10/14 0655  NA 138 140 137 136*  K 4.0 4.2 4.2 3.8  CL 98 100 98 98  CO2 22 20 23 23   GLUCOSE 245* 229* 225* 206*  BUN 17 16 11 11   CREATININE 1.04 0.90 0.76 0.67  CALCIUM 9.6 9.7 8.8 9.0   Liver Function Tests:  Recent Labs Lab 03/08/14 0132 03/08/14 0730  AST 24 23  ALT 20 21  ALKPHOS 71 74  BILITOT 0.3 0.4  PROT 7.3 7.5  ALBUMIN 3.8 3.8   No results found for this basename: LIPASE, AMYLASE,  in the last 168 hours No results found for this basename: AMMONIA,  in the last 168 hours CBC:  Recent Labs Lab 03/08/14 0132 03/08/14 0730 03/09/14 0540 03/10/14 0655 03/11/14 0455  WBC 17.2* 18.9* 17.6* 17.5* 15.0*  NEUTROABS 15.0* 16.1*  --   --   --   HGB 13.4 13.3 10.8* 9.9* 9.6*  HCT 39.0 39.3 32.0* 29.2* 28.4*  MCV 95.4 95.6 96.1 96.4 96.3  PLT 260 258 207 200 204   Cardiac Enzymes: No results found for this basename: CKTOTAL, CKMB, CKMBINDEX, TROPONINI,  in the last 168 hours BNP: BNP (last 3 results) No results found for this basename: PROBNP,  in the last 8760 hours CBG:  Recent Labs Lab 03/10/14 0633 03/10/14 1132 03/10/14 1649 03/10/14 2213 03/11/14 0626  GLUCAP 195* 207* 170* 172* 181*     Signed:  Tayshon Winker L  [facility] 03/11/2014, 9:42 AM

## 2014-03-11 NOTE — PMR Pre-admission (Signed)
PMR Admission Coordinator Pre-Admission Assessment  Patient: [name] is an 80 y.o., female MRN: [id number] DOB: 10-07-1927 Height: 5\' 3"  (160 cm) Weight: 73.483 kg (162 lb) (per patient)              Insurance Information HMO:     PPO:      PCP:      IPA:      80/20:      OTHER:  PRIMARY: Medicare A & B      Policy#: [id number]      Subscriber: self CM Name:       Phone#:      Fax#:  Pre-Cert#: verified in Facilities manager: retired Benefits:  Phone #:      Name:  Eff. Date: A: 04-22-94; B: 10-23-02     Deduct: $1260      Out of Pocket Max: none      Life Max: unlimited CIR: 100%      SNF: 100% days 1-20; 80% days 21-100 (100 day visit limits)  Outpatient: 80%     Co-Pay: 20% Home Health: 100%      Co-Pay: none DME: 80%     Co-Pay: 20% Providers:  Pt's preference  SECONDARY: AARP      Policy#: [id number]      Subscriber: self CM Name:       Phone#:      Fax#:  Pre-Cert#:       Employer:  Benefits:  Phone #:  [phone]    Name:   Emergency Contact Information Contact Information   Name Relation Home Work Mobile   Masini,Connie Daughter [phone]  [phone]   Igou,James Spouse [phone]  [phone]     Current Medical History  Patient Admitting Diagnosis: R FNF s/p Hemiarthroplasty   History of Present Illness: [name] is a 80 y.o. right-handed female with history of diabetes mellitus with peripheral neuropathy. Patient independent prior to admission living with her husband in [place] and was visiting with plans to move to [place] to be closer to family. Admitted 03/08/2014 after a fall when she tripped over her. landing on her right hip without loss of consciousness. X-rays and imaging revealed displaced right femoral neck fracture. Underwent right arthroplasty bipolar hip 03/08/2014 per Dr. Dion Saucier. Weightbearing as tolerated right lower extremity with posterior hip precautions. Postoperative pain management. Placed on Lovenox for DVT  prophylaxis. Acute blood loss anemia 9.9 and monitored. Per Dr. Lendell Caprice, pt medically stable for acute inpatient rehab.  Past Medical History  Past Medical History  Diagnosis Date  . Diabetes mellitus without complication   . Hyperlipidemia   . Sleep apnea   . Psoriasis   . Fracture of femoral neck, right 03/08/2014    Family History  family history is negative for CAD and Diabetes Mellitus II.  Prior Rehab/Hospitalizations: none   Current Medications  Current facility-administered medications:acetaminophen (TYLENOL) suppository 650 mg, 650 mg, Rectal, Q6H PRN, Eulas Post, MD;  acetaminophen (TYLENOL) tablet 650 mg, 650 mg, Oral, Q6H PRN, Eulas Post, MD, 650 mg at 03/10/14 1103;  alum & mag hydroxide-simeth (MAALOX/MYLANTA) 200-200-20 MG/5ML suspension 30 mL, 30 mL, Oral, Q4H PRN, Eulas Post, MD;  bisacodyl (DULCOLAX) suppository 10 mg, 10 mg, Rectal, Daily PRN, Eulas Post, MD clobetasol cream (TEMOVATE) 0.05 % 1 application, 1 application, Topical, Daily PRN, Eulas Post, MD;  docusate sodium (COLACE) capsule 100 mg, 100 mg, Oral, BID, Eulas Post, MD, 100 mg at 03/11/14 0926;  enoxaparin (LOVENOX) injection 40 mg, 40 mg, Subcutaneous, Q24H, Eulas Post, MD, 40 mg at 03/11/14 6063;  glimepiride (AMARYL) tablet 2 mg, 2 mg, Oral, Q breakfast, Eulas Post, MD, 2 mg at 03/11/14 0744 hydrALAZINE (APRESOLINE) injection 10 mg, 10 mg, Intravenous, Q4H PRN, Eduard Clos, MD;  HYDROcodone-acetaminophen (NORCO/VICODIN) 5-325 MG per tablet 1-2 tablet, 1-2 tablet, Oral, Q6H PRN, Eulas Post, MD, 1 tablet at 03/11/14 1155;  insulin aspart (novoLOG) injection 0-9 Units, 0-9 Units, Subcutaneous, TID WC, Eduard Clos, MD, 2 Units at 03/11/14 1151 menthol-cetylpyridinium (CEPACOL) lozenge 3 mg, 1 lozenge, Oral, PRN, Eulas Post, MD;  metoCLOPramide (REGLAN) injection 5-10 mg, 5-10 mg, Intravenous, Q8H PRN, Eulas Post, MD;  metoCLOPramide (REGLAN)  tablet 5-10 mg, 5-10 mg, Oral, Q8H PRN, Eulas Post, MD;  morphine 2 MG/ML injection 0.5 mg, 0.5 mg, Intravenous, Q2H PRN, Eulas Post, MD, 0.5 mg at 03/09/14 0425 ondansetron (ZOFRAN) injection 4 mg, 4 mg, Intravenous, Q6H PRN, Eulas Post, MD;  ondansetron (ZOFRAN) tablet 4 mg, 4 mg, Oral, Q6H PRN, Eulas Post, MD;  pantoprazole (PROTONIX) EC tablet 80 mg, 80 mg, Oral, Daily, Eulas Post, MD, 80 mg at 03/11/14 0927;  phenol (CHLORASEPTIC) mouth spray 1 spray, 1 spray, Mouth/Throat, PRN, Eulas Post, MD polyethylene glycol (MIRALAX / GLYCOLAX) packet 17 g, 17 g, Oral, Daily PRN, Eulas Post, MD;  senna (SENOKOT) tablet 8.6 mg, 1 tablet, Oral, BID, Eulas Post, MD, 8.6 mg at 03/10/14 2121;  simvastatin (ZOCOR) tablet 40 mg, 40 mg, Oral, q1800, Eulas Post, MD, 40 mg at 03/10/14 1701  Patients Current Diet: Carb Control  Precautions / Restrictions Precautions Precautions: Posterior Hip;Fall Precaution Booklet Issued: Yes (comment) Precaution Comments: Booklet issued and reviewed Restrictions Weight Bearing Restrictions: Yes RLE Weight Bearing: Weight bearing as tolerated   Prior Activity Level Community (5-7x/wk): pt was very active, got out everyday, enjoys boating with husband, gets hair done weekly  Home Assistive Devices / Equipment Home Assistive Devices/Equipment: None Home Equipment: Environmental consultant - 2 wheels;Tub bench;Shower seat  Prior Functional Level Prior Function Level of Independence: Independent  Current Functional Level Cognition  Overall Cognitive Status: Within Functional Limits for tasks assessed Orientation Level: Oriented X4 (some confusion at times, but clears quickly)    Extremity Assessment (includes Sensation/Coordination)          ADLs  Eating/Feeding: Independent  Where Assessed - Eating/Feeding: Chair  Grooming: Set up;Supervision/safety  Where Assessed - Grooming: Supported sitting  Upper Body Bathing: Set  up;Supervision/safety  Where Assessed - Upper Body Bathing: Supported sitting  Upper Body Dressing: Supervision/safety;Set up  Where Assessed - Upper Body Dressing: Supported sitting      Mobility  Overal bed mobility: +2 for physical assistance;Needs Assistance Bed Mobility: Supine to Sit Supine to sit: Mod assist;+2 for physical assistance;HOB elevated General bed mobility comments: Mod assist for trunk and leg control with supine>sit. Pt able to reach across chest and use rail to assist. Can sit EOB with contact guard assist but needs Mod assist at first due to pain and posterior lean.    Transfers  Overall transfer level: Needs assistance Equipment used: Rolling walker (2 wheeled) Transfers: Sit to/from [employer] Sit to Stand: Mod assist;+2 safety/equipment Stand pivot transfers: Mod assist;+2 safety/equipment General transfer comment: Stand pivot transfer to bedside toilet, pt incontinent before reaching commode. Needs physical assist to come to standing position and frequent cues for hand placement and upright posture. Able to  bear weight through RLE pt states "55 Lbs" of weight through operated leg.    Ambulation / Gait / Stairs / Wheelchair Mobility  Ambulation/Gait Ambulation/Gait assistance: Mod assist Ambulation Distance (Feet): 8 Feet Assistive device: Rolling walker (2 wheeled) Gait Pattern/deviations: Step-to pattern;Decreased step length - right;Decreased step length - left;Decreased stance time - right;Antalgic;Trunk flexed;Narrow base of support General Gait Details: Pt ambulates up to 8 feet, mod assist for RW control with frequent verbal cues for sequencing. Pt pushes RW too far anteriorly and needs to be constantly corrected. Pt able to take bigger steps towards end of distance with cues. Second person for safety and equipment.    Posture / Balance Dynamic Sitting Balance Sitting balance - Comments: Sits EOB with Min assist    Special needs/care  consideration BiPAP/CPAP no CPM no  Continuous Drip IV no Dialysis no         Life Vest no Oxygen - on room air in am 3-20, O2 sats noted around 90% but pt with nail polish on finger, RN aware  Special Bed no  Trach Size no  Wound Vac (area) no       Skin - R hip incision, hx of rash on bilateral LE's up to her knees, being seen by a dermatologist                                Bowel mgmt: last BM on 03-10-14 Bladder mgmt: using bedpan, some incontinence noted, does wear incontinence pads at home Diabetic mgmt - yes, manages at home with meds.   Previous Home Environment Living Arrangements: Spouse/significant other;Other (Comment) Available Help at Discharge: Friend(s);Available 24 hours/day;Family Type of Home: House Home Layout: Multi-level;Able to live on main level with bedroom/bathroom Alternate Level Stairs-Number of Steps: 4 Home Access: Stairs to enter Entrance Stairs-Rails: Right;Left Entrance Stairs-Number of Steps: 3 Home Care Services: No  Discharge Living Setting Plans for Discharge Living Setting: Other (Comment) (plan is to stay with local dtr Junious Dresseronnie here in [place]) Type of Home at Discharge: House Discharge Home Layout: Two level;Able to live on main level with bedroom/bathroom (plan to bring a bed into the den per pt/dtr) Discharge Home Access: Stairs to enter Entrance Stairs-Rails: Right;Left Entrance Stairs-Number of Steps: 2 Does the patient have any problems obtaining your medications?: No  Social/Family/Support Systems Patient Roles: Spouse;Caregiver (does help her husband who has Myasthenia Gravis (but he is doing very well. He does not need physical assistance but pt "spoils" him at home.) Contact Information: daughter Junious DresserConnie will be primary local contact Anticipated Caregiver: dtr Junious DresserConnie (note that Junious DresserConnie does work full time, is a Adult nursephysical therapist) Anticipated Industrial/product designerCaregiver's Contact Information: see above Ability/Limitations of Caregiver: dtr does  work and husband can provide supervision, min. physical assistance (noted that pt's rehab goals are for the Mod I level at DC) Caregiver Availability: 24/7 (husband will be there 24-7, dtr works during day) Discharge Plan Discussed with Primary Caregiver: Yes (discussed with other daughter Selena BattenKim who lives in [place]) Is Caregiver In Agreement with Plan?: Yes Does Caregiver/Family have Issues with Lodging/Transportation while Pt is in Rehab?: No  Note: per daughter Selena BattenKim, her parents were here in [place] visiting other sister when pt had the fall. The plan is for both parents to stay here in [place] with other daughter Junious DresserConnie for more support as they live in [place].     Goals/Additional Needs Patient/Family Goal for Rehab: Mod I w/ PT and  OT, NA w/ SLP Expected length of stay: 7-10 days Cultural Considerations: none Dietary Needs: carb modified Equipment Needs: to be determined Pt/Family Agrees to Admission and willing to participate: Yes (spoke with pt and her daughter on 03-11-14) Program Orientation Provided & Reviewed with Pt/Caregiver Including Roles  & Responsibilities: Yes   Decrease burden of Care through IP rehab admission: NA   Possible need for SNF placement upon discharge: not anticipated   Patient Condition: This patient's condition remains as documented in the consult dated 03-10-14, in which the Rehabilitation Physician determined and documented that the patient's condition is appropriate for intensive rehabilitative care in an inpatient rehabilitation facility. Will admit to inpatient rehab today.  Preadmission Screen Completed By:  Juliann Mule, PT 03/11/2014 1:05 PM ______________________________________________________________________   Discussed status with Dr. Riley Kill on 03-11-14 at 1313 and received telephone approval for admission today.  Admission Coordinator:  Juliann Mule, PT time 1313/Date3-20-15

## 2014-03-11 NOTE — H&P (Signed)
Physical Medicine and Rehabilitation Admission H&P    No chief complaint on file. :  Chief complaint: Hip pain  HPI: [name] is a 80 y.o. right-handed female with history of diabetes mellitus with peripheral neuropathy. Patient independent prior to admission living with her husband in [place] and was visiting with plans to move to [place] to be closer to family. Admitted 03/08/2014 after a fall when she tripped over her. landing on her right hip without loss of consciousness. X-rays and imaging revealed displaced right femoral neck fracture. Underwent right arthroplasty bipolar hip 03/08/2014 per Dr. Mardelle Matte.  Weightbearing as tolerated right lower extremity with posterior hip precautions. Postoperative pain management. Placed on Lovenox for DVT prophylaxis. Acute blood loss anemia 9.6 and monitored. Physical and occupational therapy evaluations completed with recommendations for physical medicine rehabilitation consult. Patient was admitted for comprehensive rehabilitation program   ROS Review of Systems  Gastrointestinal: Positive for constipation.  GERD  Musculoskeletal: Positive for myalgias.  All other systems reviewed and are negative  Past Medical History  Diagnosis Date  . Diabetes mellitus without complication   . Hyperlipidemia   . Sleep apnea   . Psoriasis   . Fracture of femoral neck, right 03/08/2014   Past Surgical History  Procedure Laterality Date  . Knee surgry    . Breast biopsy     Family History  Problem Relation Age of Onset  . CAD Neg Hx   . Diabetes Mellitus II Neg Hx    Social History:  reports that she has never smoked. She has never used smokeless tobacco. She reports that she does not drink alcohol or use illicit drugs. Allergies:  Allergies  Allergen Reactions  . Penicillins Other (See Comments)    Unknown; tolerates Ancef   Medications Prior to Admission  Medication Sig Dispense Refill  . clobetasol cream (TEMOVATE) 0.10 %  Apply 1 application topically daily as needed (rash).      Marland Kitchen glimepiride (AMARYL) 2 MG tablet Take 2 mg by mouth daily with breakfast.      . metFORMIN (GLUCOPHAGE-XR) 500 MG 24 hr tablet Take 500 mg by mouth 2 (two) times daily.      Marland Kitchen omeprazole (PRILOSEC) 20 MG capsule Take 20 mg by mouth daily.      . simvastatin (ZOCOR) 40 MG tablet Take 40 mg by mouth daily.        Home: Home Living Family/patient expects to be discharged to:: Inpatient rehab Living Arrangements: Spouse/significant other;Other (Comment) Available Help at Discharge: Friend(s);Available 24 hours/day;Family Type of Home: House Home Access: Stairs to enter [employer] of Steps: 3 Entrance Stairs-Rails: Right;Left Home Layout: Multi-level;Able to live on main level with bedroom/bathroom Alternate Level Stairs-Number of Steps: 4 Home Equipment: Walker - 2 wheels;Tub bench;Shower seat   Functional History:    Functional Status:  Mobility: mod assist with basic transfers and mobility     Ambulation/Gait Ambulation Distance (Feet): 8 Feet General Gait Details: Pt ambulates up to 8 feet, mod assist for RW control with frequent verbal cues for sequencing. Pt pushes RW too far anteriorly and needs to be constantly corrected. Pt able to take bigger steps towards end of distance with cues. Second person for safety and equipment.    ADL: ADL Eating/Feeding: Independent Where Assessed - Eating/Feeding: Chair Grooming: Set up;Supervision/safety Where Assessed - Grooming: Supported sitting Upper Body Bathing: Set up;Supervision/safety Where Assessed - Upper Body Bathing: Supported sitting Upper Body Dressing: Supervision/safety;Set up Where Assessed - Upper Body Dressing: Supported sitting  Cognition: Cognition Overall Cognitive Status: Within Functional Limits for tasks assessed Orientation Level: Oriented X4 Cognition Arousal/Alertness: Awake/alert Behavior During Therapy: WFL for tasks  assessed/performed Overall Cognitive Status: Within Functional Limits for tasks assessed  Physical Exam: Blood pressure 126/55, pulse 89, temperature 98.3 F (36.8 C), temperature source Oral, resp. rate 18, height _0  (1.6 m), weight 73.483 kg (162 lb), SpO2 93.00%. Physical Exam Constitutional: She is oriented to person, place, and time.  HENT: oral mucosa pink and moist, dentition fair Head: Normocephalic.  Eyes: EOM are normal.  Neck: Normal range of motion. Neck supple. No thyromegaly present.  Cardiovascular: Normal rate and regular rhythm. No murmurs, rubs, or gallops Respiratory: Effort normal and breath sounds normal. No respiratory distress. No wheezes, rales, or rhonchi GI: Soft. Bowel sounds are normal. She exhibits no distension.  Neurological: She is alert and oriented to person, place, and time.  Follows full commands. CN exam intact. Motor 5/5 bilateral UE. RHF 2-, KE 2+,R ankle 4/5. LLE 4/5 HF, KE and 5/5 at ankle. No sensory deficits. Cognitively appropriate with good insight and awareness. Skin:  Hip incision clean with minimal drainage, foam dressing in place    Psychiatric: She has a normal mood and affect.  Motor 5/5 in BUE  4/5 LLE  2-/5 R HF, KE, 4/5 R ADF  Sensation intact LT in BLE  Calves soft non tender  Results for orders placed during the hospital encounter of 03/07/14 (from the past 48 hour(s))  GLUCOSE, CAPILLARY     Status: Abnormal   Collection Time    03/09/14 11:06 AM      Result Value Ref Range   Glucose-Capillary 204 (*) 70 - 99 mg/dL  GLUCOSE, CAPILLARY     Status: Abnormal   Collection Time    03/09/14  4:54 PM      Result Value Ref Range   Glucose-Capillary 156 (*) 70 - 99 mg/dL  GLUCOSE, CAPILLARY     Status: Abnormal   Collection Time    03/09/14  9:34 PM      Result Value Ref Range   Glucose-Capillary 159 (*) 70 - 99 mg/dL  GLUCOSE, CAPILLARY     Status: Abnormal   Collection Time    03/10/14  6:33 AM      Result Value Ref  Range   Glucose-Capillary 195 (*) 70 - 99 mg/dL  CBC     Status: Abnormal   Collection Time    03/10/14  6:55 AM      Result Value Ref Range   WBC 17.5 (*) 4.0 - 10.5 K/uL   RBC 3.03 (*) 3.87 - 5.11 MIL/uL   Hemoglobin 9.9 (*) 12.0 - 15.0 g/dL   HCT 29.2 (*) 36.0 - 46.0 %   MCV 96.4  78.0 - 100.0 fL   MCH 32.7  26.0 - 34.0 pg   MCHC 33.9  30.0 - 36.0 g/dL   RDW 13.2  11.5 - 15.5 %   Platelets 200  150 - 400 K/uL  BASIC METABOLIC PANEL     Status: Abnormal   Collection Time    03/10/14  6:55 AM      Result Value Ref Range   Sodium 136 (*) 137 - 147 mEq/L   Potassium 3.8  3.7 - 5.3 mEq/L   Chloride 98  96 - 112 mEq/L   CO2 23  19 - 32 mEq/L   Glucose, Bld 206 (*) 70 - 99 mg/dL   BUN 11  6 - 23 mg/dL  Creatinine, Ser 0.67  0.50 - 1.10 mg/dL   Calcium 9.0  8.4 - 10.5 mg/dL   GFR calc non Af Amer 79 (*) >90 mL/min   GFR calc Af Amer >90  >90 mL/min   Comment: (NOTE)     The eGFR has been calculated using the CKD EPI equation.     This calculation has not been validated in all clinical situations.     eGFR's persistently <90 mL/min signify possible Chronic Kidney     Disease.  GLUCOSE, CAPILLARY     Status: Abnormal   Collection Time    03/10/14 11:32 AM      Result Value Ref Range   Glucose-Capillary 207 (*) 70 - 99 mg/dL  GLUCOSE, CAPILLARY     Status: Abnormal   Collection Time    03/10/14  4:49 PM      Result Value Ref Range   Glucose-Capillary 170 (*) 70 - 99 mg/dL  GLUCOSE, CAPILLARY     Status: Abnormal   Collection Time    03/10/14 10:13 PM      Result Value Ref Range   Glucose-Capillary 172 (*) 70 - 99 mg/dL  CBC     Status: Abnormal   Collection Time    03/11/14  4:55 AM      Result Value Ref Range   WBC 15.0 (*) 4.0 - 10.5 K/uL   RBC 2.95 (*) 3.87 - 5.11 MIL/uL   Hemoglobin 9.6 (*) 12.0 - 15.0 g/dL   HCT 28.4 (*) 36.0 - 46.0 %   MCV 96.3  78.0 - 100.0 fL   MCH 32.5  26.0 - 34.0 pg   MCHC 33.8  30.0 - 36.0 g/dL   RDW 13.3  11.5 - 15.5 %   Platelets  204  150 - 400 K/uL  GLUCOSE, CAPILLARY     Status: Abnormal   Collection Time    03/11/14  6:26 AM      Result Value Ref Range   Glucose-Capillary 181 (*) 70 - 99 mg/dL   Dg Chest 2 View  03/10/2014   CLINICAL DATA:  Leukcoytosis, acute resp failure  EXAM: CHEST  2 VIEW  COMPARISON:  None.  FINDINGS: Low lung volumes. Cardiac silhouette mild to moderate enlarged. Lungs are clear. No acute osseous abnormalities.  IMPRESSION: No active cardiopulmonary disease.   Electronically Signed   By: Margaree Mackintosh M.D.   On: 03/10/2014 15:46    Post Admission Physician Evaluation: 1. Functional deficits secondary  to right femoral neck fx s/p right bipolar hip hemi. 2. Patient is admitted to receive collaborative, interdisciplinary care between the physiatrist, rehab nursing staff, and therapy team. 3. Patient's level of medical complexity and substantial therapy needs in context of that medical necessity cannot be provided at a lesser intensity of care such as a SNF. 4. Patient has experienced substantial functional loss from his/her baseline which was documented above under the "Functional History" and "Functional Status" headings.  Judging by the patient's diagnosis, physical exam, and functional history, the patient has potential for functional progress which will result in measurable gains while on inpatient rehab.  These gains will be of substantial and practical use upon discharge  in facilitating mobility and self-care at the household level. 5. Physiatrist will provide 24 hour management of medical needs as well as oversight of the therapy plan/treatment and provide guidance as appropriate regarding the interaction of the two. 6. 24 hour rehab nursing will assist with bladder management, bowel management, safety, skin/wound care, disease  management, medication administration, pain management and patient education  and help integrate therapy concepts, techniques,education, etc. 7. PT will assess and  treat for/with: Lower extremity strength, range of motion, stamina, balance, functional mobility, safety, adaptive techniques and equipment, hip precautions, pain mgt, education.   Goals are: supervision to mod I. 8. OT will assess and treat for/with: ADL's, functional mobility, safety, upper extremity strength, adaptive techniques and equipment, hip precautions ,pain mgt, family ed.   Goals are: mod I to set up. 9. SLP will assess and treat for/with: n/a.  Goals are: n/a. 10. Case Management and Social Worker will assess and treat for psychological issues and discharge planning. 11. Team conference will be held weekly to assess progress toward goals and to determine barriers to discharge. 12. Patient will receive at least 3 hours of therapy per day at least 5 days per week. 13. ELOS: 10-14 days       14. Prognosis:  excellent   Medical Problem List and Plan: 1. Displaced right femoral neck fracture. Status post right bipolar hip arthroplasty. Weightbearing as tolerated posterior hip precautions 2. DVT Prophylaxis/Anticoagulation: Subcutaneous Lovenox. Monitor for any bleeding episodes. Check vascular study on admit. 3. Pain Management: Hydrocodone and Robaxin as needed. Monitor with increased mobility 4. Neuropsych: This patient is capable of making decisions on her own behalf. 5. Acute blood loss anemia. Followup CBC on Monday. Fe++ supp, diet. No signs of bleeding 6. Diabetes mellitus with peripheral neuropathy. Blood sugars 170-207. Amaryl 2 mg daily. Check blood sugars a.c. and at bedtime. Patient also on Glucophage 500 mg twice daily prior to admission. Resume as indicated 7. Hyperlipidemia. Zocor 8. GERD. Protonix  Meredith Staggers, MD, [facility]  03/11/2014

## 2014-03-11 NOTE — Care Management Note (Signed)
    03/11/14 Vance PeperSusan Zaiah Eckerson, RN BSN Case Manager Patient's disposition has been changed to Inpatient rehab

## 2014-03-11 NOTE — Progress Notes (Signed)
New admit received per bed alert and oriented x 4 with no noted distress. Vital signs stable. Daughter @ beside. Oriented pt/family to room, orientation packet given to pt/family.

## 2014-03-11 NOTE — Progress Notes (Signed)
Patient ID: [name], female   DOB: 09/22/1927, 80 y.o.   MRN: [id number]     Subjective:  Patient reports pain as mild.  She states that she is doing much better today.  In no acute distress   Objective:   VITALS:   Filed Vitals:   03/11/14 0627 03/11/14 0740 03/11/14 1128 03/11/14 1226  BP: 126/55   131/48  Pulse: 89   106  Temp: 98.3 F (36.8 C)   98.5 F (36.9 C)  TempSrc: Oral     Resp: 18 16 16 16   Height:      Weight:      SpO2: 93%   96%    ABD soft Sensation intact distally Dorsiflexion/Plantar flexion intact Incision: dressing C/D/I and no drainage   Lab Results  Component Value Date   WBC 15.0* 03/11/2014   HGB 9.6* 03/11/2014   HCT 28.4* 03/11/2014   MCV 96.3 03/11/2014   PLT 204 03/11/2014     Assessment/Plan: 3 Days Post-Op   Principal Problem:   Fracture of femoral neck, right Active Problems:   Diabetes mellitus   Leucocytosis   Elevated blood pressure   Advance diet Up with therapy WBAT Dry dressing PRN Continue plan per medicine Okay for SNF   Haskel KhanDOUGLAS Terrika Zuver 03/11/2014, 3:37 PM   Teryl LucyJoshua Landau, MD Cell [phone]

## 2014-03-11 NOTE — H&P (View-Only) (Signed)
Physical Medicine and Rehabilitation Admission H&P    No chief complaint on file. :  Chief complaint: Hip pain  HPI: [name] is a 80 y.o. right-handed female with history of diabetes mellitus with peripheral neuropathy. Patient independent prior to admission living with her husband in [place] and was visiting with plans to move to [place] to be closer to family. Admitted 03/08/2014 after a fall when she tripped over her. landing on her right hip without loss of consciousness. X-rays and imaging revealed displaced right femoral neck fracture. Underwent right arthroplasty bipolar hip 03/08/2014 per Dr. Mardelle Matte.  Weightbearing as tolerated right lower extremity with posterior hip precautions. Postoperative pain management. Placed on Lovenox for DVT prophylaxis. Acute blood loss anemia 9.6 and monitored. Physical and occupational therapy evaluations completed with recommendations for physical medicine rehabilitation consult. Patient was admitted for comprehensive rehabilitation program   ROS Review of Systems  Gastrointestinal: Positive for constipation.  GERD  Musculoskeletal: Positive for myalgias.  All other systems reviewed and are negative  Past Medical History  Diagnosis Date  . Diabetes mellitus without complication   . Hyperlipidemia   . Sleep apnea   . Psoriasis   . Fracture of femoral neck, right 03/08/2014   Past Surgical History  Procedure Laterality Date  . Knee surgry    . Breast biopsy     Family History  Problem Relation Age of Onset  . CAD Neg Hx   . Diabetes Mellitus II Neg Hx    Social History:  reports that she has never smoked. She has never used smokeless tobacco. She reports that she does not drink alcohol or use illicit drugs. Allergies:  Allergies  Allergen Reactions  . Penicillins Other (See Comments)    Unknown; tolerates Ancef   Medications Prior to Admission  Medication Sig Dispense Refill  . clobetasol cream (TEMOVATE) 0.10 %  Apply 1 application topically daily as needed (rash).      Marland Kitchen glimepiride (AMARYL) 2 MG tablet Take 2 mg by mouth daily with breakfast.      . metFORMIN (GLUCOPHAGE-XR) 500 MG 24 hr tablet Take 500 mg by mouth 2 (two) times daily.      Marland Kitchen omeprazole (PRILOSEC) 20 MG capsule Take 20 mg by mouth daily.      . simvastatin (ZOCOR) 40 MG tablet Take 40 mg by mouth daily.        Home: Home Living Family/patient expects to be discharged to:: Inpatient rehab Living Arrangements: Spouse/significant other;Other (Comment) Available Help at Discharge: Friend(s);Available 24 hours/day;Family Type of Home: House Home Access: Stairs to enter [employer] of Steps: 3 Entrance Stairs-Rails: Right;Left Home Layout: Multi-level;Able to live on main level with bedroom/bathroom Alternate Level Stairs-Number of Steps: 4 Home Equipment: Walker - 2 wheels;Tub bench;Shower seat   Functional History:    Functional Status:  Mobility: mod assist with basic transfers and mobility     Ambulation/Gait Ambulation Distance (Feet): 8 Feet General Gait Details: Pt ambulates up to 8 feet, mod assist for RW control with frequent verbal cues for sequencing. Pt pushes RW too far anteriorly and needs to be constantly corrected. Pt able to take bigger steps towards end of distance with cues. Second person for safety and equipment.    ADL: ADL Eating/Feeding: Independent Where Assessed - Eating/Feeding: Chair Grooming: Set up;Supervision/safety Where Assessed - Grooming: Supported sitting Upper Body Bathing: Set up;Supervision/safety Where Assessed - Upper Body Bathing: Supported sitting Upper Body Dressing: Supervision/safety;Set up Where Assessed - Upper Body Dressing: Supported sitting  Cognition: Cognition Overall Cognitive Status: Within Functional Limits for tasks assessed Orientation Level: Oriented X4 Cognition Arousal/Alertness: Awake/alert Behavior During Therapy: WFL for tasks  assessed/performed Overall Cognitive Status: Within Functional Limits for tasks assessed  Physical Exam: Blood pressure 126/55, pulse 89, temperature 98.3 F (36.8 C), temperature source Oral, resp. rate 18, height _0  (1.6 m), weight 73.483 kg (162 lb), SpO2 93.00%. Physical Exam Constitutional: She is oriented to person, place, and time.  HENT: oral mucosa pink and moist, dentition fair Head: Normocephalic.  Eyes: EOM are normal.  Neck: Normal range of motion. Neck supple. No thyromegaly present.  Cardiovascular: Normal rate and regular rhythm. No murmurs, rubs, or gallops Respiratory: Effort normal and breath sounds normal. No respiratory distress. No wheezes, rales, or rhonchi GI: Soft. Bowel sounds are normal. She exhibits no distension.  Neurological: She is alert and oriented to person, place, and time.  Follows full commands. CN exam intact. Motor 5/5 bilateral UE. RHF 2-, KE 2+,R ankle 4/5. LLE 4/5 HF, KE and 5/5 at ankle. No sensory deficits. Cognitively appropriate with good insight and awareness. Skin:  Hip incision clean with minimal drainage, foam dressing in place    Psychiatric: She has a normal mood and affect.  Motor 5/5 in BUE  4/5 LLE  2-/5 R HF, KE, 4/5 R ADF  Sensation intact LT in BLE  Calves soft non tender  Results for orders placed during the hospital encounter of 03/07/14 (from the past 48 hour(s))  GLUCOSE, CAPILLARY     Status: Abnormal   Collection Time    03/09/14 11:06 AM      Result Value Ref Range   Glucose-Capillary 204 (*) 70 - 99 mg/dL  GLUCOSE, CAPILLARY     Status: Abnormal   Collection Time    03/09/14  4:54 PM      Result Value Ref Range   Glucose-Capillary 156 (*) 70 - 99 mg/dL  GLUCOSE, CAPILLARY     Status: Abnormal   Collection Time    03/09/14  9:34 PM      Result Value Ref Range   Glucose-Capillary 159 (*) 70 - 99 mg/dL  GLUCOSE, CAPILLARY     Status: Abnormal   Collection Time    03/10/14  6:33 AM      Result Value Ref  Range   Glucose-Capillary 195 (*) 70 - 99 mg/dL  CBC     Status: Abnormal   Collection Time    03/10/14  6:55 AM      Result Value Ref Range   WBC 17.5 (*) 4.0 - 10.5 K/uL   RBC 3.03 (*) 3.87 - 5.11 MIL/uL   Hemoglobin 9.9 (*) 12.0 - 15.0 g/dL   HCT 29.2 (*) 36.0 - 46.0 %   MCV 96.4  78.0 - 100.0 fL   MCH 32.7  26.0 - 34.0 pg   MCHC 33.9  30.0 - 36.0 g/dL   RDW 13.2  11.5 - 15.5 %   Platelets 200  150 - 400 K/uL  BASIC METABOLIC PANEL     Status: Abnormal   Collection Time    03/10/14  6:55 AM      Result Value Ref Range   Sodium 136 (*) 137 - 147 mEq/L   Potassium 3.8  3.7 - 5.3 mEq/L   Chloride 98  96 - 112 mEq/L   CO2 23  19 - 32 mEq/L   Glucose, Bld 206 (*) 70 - 99 mg/dL   BUN 11  6 - 23 mg/dL  Creatinine, Ser 0.67  0.50 - 1.10 mg/dL   Calcium 9.0  8.4 - 10.5 mg/dL   GFR calc non Af Amer 79 (*) >90 mL/min   GFR calc Af Amer >90  >90 mL/min   Comment: (NOTE)     The eGFR has been calculated using the CKD EPI equation.     This calculation has not been validated in all clinical situations.     eGFR's persistently <90 mL/min signify possible Chronic Kidney     Disease.  GLUCOSE, CAPILLARY     Status: Abnormal   Collection Time    03/10/14 11:32 AM      Result Value Ref Range   Glucose-Capillary 207 (*) 70 - 99 mg/dL  GLUCOSE, CAPILLARY     Status: Abnormal   Collection Time    03/10/14  4:49 PM      Result Value Ref Range   Glucose-Capillary 170 (*) 70 - 99 mg/dL  GLUCOSE, CAPILLARY     Status: Abnormal   Collection Time    03/10/14 10:13 PM      Result Value Ref Range   Glucose-Capillary 172 (*) 70 - 99 mg/dL  CBC     Status: Abnormal   Collection Time    03/11/14  4:55 AM      Result Value Ref Range   WBC 15.0 (*) 4.0 - 10.5 K/uL   RBC 2.95 (*) 3.87 - 5.11 MIL/uL   Hemoglobin 9.6 (*) 12.0 - 15.0 g/dL   HCT 28.4 (*) 36.0 - 46.0 %   MCV 96.3  78.0 - 100.0 fL   MCH 32.5  26.0 - 34.0 pg   MCHC 33.8  30.0 - 36.0 g/dL   RDW 13.3  11.5 - 15.5 %   Platelets  204  150 - 400 K/uL  GLUCOSE, CAPILLARY     Status: Abnormal   Collection Time    03/11/14  6:26 AM      Result Value Ref Range   Glucose-Capillary 181 (*) 70 - 99 mg/dL   Dg Chest 2 View  03/10/2014   CLINICAL DATA:  Leukcoytosis, acute resp failure  EXAM: CHEST  2 VIEW  COMPARISON:  None.  FINDINGS: Low lung volumes. Cardiac silhouette mild to moderate enlarged. Lungs are clear. No acute osseous abnormalities.  IMPRESSION: No active cardiopulmonary disease.   Electronically Signed   By: Margaree Mackintosh M.D.   On: 03/10/2014 15:46    Post Admission Physician Evaluation: 1. Functional deficits secondary  to right femoral neck fx s/p right bipolar hip hemi. 2. Patient is admitted to receive collaborative, interdisciplinary care between the physiatrist, rehab nursing staff, and therapy team. 3. Patient's level of medical complexity and substantial therapy needs in context of that medical necessity cannot be provided at a lesser intensity of care such as a SNF. 4. Patient has experienced substantial functional loss from his/her baseline which was documented above under the "Functional History" and "Functional Status" headings.  Judging by the patient's diagnosis, physical exam, and functional history, the patient has potential for functional progress which will result in measurable gains while on inpatient rehab.  These gains will be of substantial and practical use upon discharge  in facilitating mobility and self-care at the household level. 5. Physiatrist will provide 24 hour management of medical needs as well as oversight of the therapy plan/treatment and provide guidance as appropriate regarding the interaction of the two. 6. 24 hour rehab nursing will assist with bladder management, bowel management, safety, skin/wound care, disease  management, medication administration, pain management and patient education  and help integrate therapy concepts, techniques,education, etc. 7. PT will assess and  treat for/with: Lower extremity strength, range of motion, stamina, balance, functional mobility, safety, adaptive techniques and equipment, hip precautions, pain mgt, education.   Goals are: supervision to mod I. 8. OT will assess and treat for/with: ADL's, functional mobility, safety, upper extremity strength, adaptive techniques and equipment, hip precautions ,pain mgt, family ed.   Goals are: mod I to set up. 9. SLP will assess and treat for/with: n/a.  Goals are: n/a. 10. Case Management and Social Worker will assess and treat for psychological issues and discharge planning. 11. Team conference will be held weekly to assess progress toward goals and to determine barriers to discharge. 12. Patient will receive at least 3 hours of therapy per day at least 5 days per week. 13. ELOS: 10-14 days       14. Prognosis:  excellent   Medical Problem List and Plan: 1. Displaced right femoral neck fracture. Status post right bipolar hip arthroplasty. Weightbearing as tolerated posterior hip precautions 2. DVT Prophylaxis/Anticoagulation: Subcutaneous Lovenox. Monitor for any bleeding episodes. Check vascular study on admit. 3. Pain Management: Hydrocodone and Robaxin as needed. Monitor with increased mobility 4. Neuropsych: This patient is capable of making decisions on her own behalf. 5. Acute blood loss anemia. Followup CBC on Monday. Fe++ supp, diet. No signs of bleeding 6. Diabetes mellitus with peripheral neuropathy. Blood sugars 170-207. Amaryl 2 mg daily. Check blood sugars a.c. and at bedtime. Patient also on Glucophage 500 mg twice daily prior to admission. Resume as indicated 7. Hyperlipidemia. Zocor 8. GERD. Protonix  Meredith Staggers, MD, [facility]  03/11/2014

## 2014-03-11 NOTE — Interval H&P Note (Signed)
[name] was admitted today to Inpatient Rehabilitation with the diagnosis of right FNF  S/p bipolar hip arthroplasty.  The patient's history has been reviewed, patient examined, and there is no change in status.  Patient continues to be appropriate for intensive inpatient rehabilitation.  I have reviewed the patient's chart and labs.  Questions were answered to the patient's satisfaction.  SWARTZ,ZACHARY T 03/11/2014, 8:48 PM

## 2014-03-11 NOTE — Progress Notes (Signed)
Rehab admissions - I met with pt and her daughter and explained the possibility of inpatient rehab. Questions were answered and informational brochures were given. Pt and her dtr are very interested in coming to CIR and bed is available today. Spoke personally with Dr. Conley Canal who medically cleared pt and will admit pt later today to inpt rehab.  Updated Ricki Miller, case manager and RN as well. Please call me with any questions. Thanks.  Nanetta Batty, PT Rehabilitation Admissions Coordinator [phone]

## 2014-03-12 ENCOUNTER — Inpatient Hospital Stay (HOSPITAL_COMMUNITY): Payer: Medicare Other

## 2014-03-12 ENCOUNTER — Inpatient Hospital Stay (HOSPITAL_COMMUNITY): Payer: Medicare Other | Admitting: Physical Therapy

## 2014-03-12 ENCOUNTER — Inpatient Hospital Stay (HOSPITAL_COMMUNITY): Payer: Medicare Other | Admitting: *Deleted

## 2014-03-12 DIAGNOSIS — W010XXA Fall on same level from slipping, tripping and stumbling without subsequent striking against object, initial encounter: Secondary | ICD-10-CM

## 2014-03-12 DIAGNOSIS — S72009A Fracture of unspecified part of neck of unspecified femur, initial encounter for closed fracture: Secondary | ICD-10-CM

## 2014-03-12 LAB — GLUCOSE, CAPILLARY
GLUCOSE-CAPILLARY: 224 mg/dL — AB (ref 70–99)
Glucose-Capillary: 166 mg/dL — ABNORMAL HIGH (ref 70–99)
Glucose-Capillary: 165 mg/dL — ABNORMAL HIGH (ref 70–99)
Glucose-Capillary: 150 mg/dL — ABNORMAL HIGH (ref 70–99)

## 2014-03-12 NOTE — Progress Notes (Signed)
[facility]     PROGRESS NOTE    Subjective/Complaints: No problems. Had the best night of sleep she's had yet! A 12 point review of systems has been performed and if not noted above is otherwise negative.   Objective: Vital Signs: Blood pressure 130/78, pulse 88, temperature 98.2 F (36.8 C), temperature source Oral, resp. rate 18, height 5\' 3"  (1.6 m), weight 76.204 kg (168 lb), SpO2 97.00%. Dg Chest 2 View  03/10/2014   CLINICAL DATA:  Leukcoytosis, acute resp failure  EXAM: CHEST  2 VIEW  COMPARISON:  None.  FINDINGS: Low lung volumes. Cardiac silhouette mild to moderate enlarged. Lungs are clear. No acute osseous abnormalities.  IMPRESSION: No active cardiopulmonary disease.   Electronically Signed   By: Salome HolmesHector  Cooper M.D.   On: 03/10/2014 15:46    Recent Labs  03/10/14 0655 03/11/14 0455  WBC 17.5* 15.0*  HGB 9.9* 9.6*  HCT 29.2* 28.4*  PLT 200 204    Recent Labs  03/10/14 0655 03/11/14 2040  NA 136*  --   K 3.8  --   CL 98  --   GLUCOSE 206*  --   BUN 11  --   CREATININE 0.67 0.74  CALCIUM 9.0  --    CBG (last 3)   Recent Labs  03/11/14 1126 03/11/14 2048 03/12/14 0739  GLUCAP 164* 148* 166*    Wt Readings from Last 3 Encounters:  03/11/14 76.204 kg (168 lb)  03/08/14 73.483 kg (162 lb)  03/08/14 73.483 kg (162 lb)    Physical Exam:  Constitutional: She is oriented to person, place, and time.  HENT: oral mucosa pink and moist, dentition fair  Head: Normocephalic.  Eyes: EOM are normal.  Neck: Normal range of motion. Neck supple. No thyromegaly present.  Cardiovascular: Normal rate and regular rhythm. No murmurs, rubs, or gallops  Respiratory: Effort normal and breath sounds normal. No respiratory distress. No wheezes, rales, or rhonchi. Wearing [place] oxygen  GI: Soft. Bowel sounds are normal. She exhibits no distension.  Neurological: She is alert and oriented to person, place, and time.  Follows full commands.  CN exam intact. Motor 5/5 bilateral UE. RHF 2-, KE 2+,R ankle 4/5. LLE 4/5 HF, KE and 5/5 at ankle. No sensory deficits. Cognitively appropriate with good insight and awareness.  Skin:  Hip incision clean with minimal drainage, foam dressing in place--stable   Assessment/Plan: 1. Functional deficits secondary to right femoral neck fracture s/p bipolar hip replacement which require 3+ hours per day of interdisciplinary therapy in a comprehensive inpatient rehab setting. Physiatrist is providing close team supervision and 24 hour management of active medical problems listed below. Physiatrist and rehab team continue to assess barriers to discharge/monitor patient progress toward functional and medical goals. FIM:                   Comprehension Comprehension Mode: Auditory Comprehension: 3-Understands basic 50 - 74% of the time/requires cueing 25 - 50%  of the time  Expression Expression Mode: Verbal Expression: 5-Expresses basic 90% of the time/requires cueing < 10% of the time.  Social Interaction Social Interaction: 5-Interacts appropriately 90% of the time - Needs monitoring or encouragement for participation or interaction.  Problem Solving Problem Solving: 3-Solves basic 50 - 74% of the time/requires cueing 25 - 49% of the time  Memory Memory: 2-Recognizes or recalls 25 - 49% of the time/requires cueing 51 - 75% of the time  Medical Problem List and Plan:  1. Displaced right femoral neck fracture. Status post right bipolar hip arthroplasty. Weightbearing as tolerated posterior hip precautions  2. DVT Prophylaxis/Anticoagulation: Subcutaneous Lovenox. Monitor for any bleeding episodes. Check vascular study  .  3. Pain Management: Hydrocodone and Robaxin as needed. Monitor with increased mobility  4. Neuropsych: This patient is capable of making decisions on her own behalf.  5. Acute blood loss anemia. Followup CBC on Monday. Fe++ supp, diet. No signs of bleeding  6.  Diabetes mellitus with peripheral neuropathy. Blood sugars showing improvement over the last 24hr.    -on Amaryl 2 mg daily.    -patient also on Glucophage 500 mg twice daily prior to admission. Resume as indicated  7. Hyperlipidemia. Zocor  8. GERD. Protonix  LOS (Days) 1 A FACE TO FACE EVALUATION WAS PERFORMED  Shelia Kingsberry T 03/12/2014 9:40 AM

## 2014-03-12 NOTE — Evaluation (Signed)
Physical Therapy Assessment and Plan  Patient Details  Name: [name] MRN: [id number] Date of Birth: 1927/01/13  PT Diagnosis: Difficulty walking, Muscle weakness, Osteoarthritis and Pain in joint Rehab Potential: Good ELOS: 14-18 days   Today's Date: 03/12/2014 Time: 3335-4562 Time Calculation (min): 60 min  Problem List:  Patient Active Problem List   Diagnosis Date Noted  . Femoral neck fracture 03/11/2014  . Fracture of femoral neck, right 03/08/2014  . Diabetes mellitus 03/08/2014  . Leucocytosis 03/08/2014  . Elevated blood pressure 03/08/2014    Past Medical History:  Past Medical History  Diagnosis Date  . Diabetes mellitus without complication   . Hyperlipidemia   . Sleep apnea   . Psoriasis   . Fracture of femoral neck, right 03/08/2014   Past Surgical History:  Past Surgical History  Procedure Laterality Date  . Knee surgry    . Breast biopsy    . Hip arthroplasty Right 03/08/2014    Procedure: ARTHROPLASTY BIPOLAR HIP; RIGHT;  Surgeon: Johnny Bridge, MD;  Location: [facility];  Service: Orthopedics;  Laterality: Right;    Assessment & Plan Clinical Impression: Patient is a 80 y.o. year old female who was visiting her daughter in [place], [place] from [place], [place] and fell at the daughters home sustaining a right displaced femoral neck fracture, now s/p right arthroplasty bipolar hip (WBAT) with posterior hip precautions.  Patient transferred to CIR on 03/11/2014 .   Patient currently requires mod A with mobility secondary to muscle weakness.  Prior to hospitalization, patient was independent  with mobility and lived with Spouse;Other (Comment) (Pt lives in [place], [place] but was in [place] to visit daughter when fall occurred and will plan to d/c home to daughters home at discharge. ) in a House (Daughters home) home. Patients husband will be able to assist at discharge. Patients daughter is a physical therapist and will be in/out of the home. Patients other  daughter will be in/out also from [place], [place].  Home access is 2Stairs to enter with possibly bilateral rails, patient is unsure.  Patient will benefit from skilled PT intervention to maximize safe functional mobility and decrease caregiver burden for planned discharge home with 24 hour supervision.  Anticipate patient will benefit from follow up [place] at discharge.  PT - End of Session Activity Tolerance: Tolerates 30+ min activity with multiple rests Endurance Deficit: Yes Endurance Deficit Description: Pt fatigues quickly with activity. Pt reported dizziness with upright activity. In sitting after x1 tsf and w/c propulsion, BP; 132/63, HR: 102, O2: 93%. Pt reported increase in dizziness with ambulation, BP: 141/67, HR: 106 BPM. Symptoms resolve with rest.  PT Assessment Rehab Potential: Good Barriers to Discharge: Other (comment) (Pt currently not in the town where she resides of [place], [place]. Pt plans to d/c from rehab to daughter's home to stay until strong enough to travel home with her husband. ) PT Patient demonstrates impairments in the following area(s): Balance;Endurance;Motor;Pain;Safety PT Transfers Functional Problem(s): Bed Mobility;Bed to Chair;Car;Furniture PT Locomotion Functional Problem(s): Ambulation;Stairs;Wheelchair Mobility PT Plan PT Intensity: Minimum of 1-2 x/day ,45 to 90 minutes PT Frequency: 5 out of 7 days PT Duration Estimated Length of Stay: 14-18 days PT Treatment/Interventions: Ambulation/gait training;Community reintegration;Discharge planning;Disease management/prevention;DME/adaptive equipment instruction;Functional mobility training;Neuromuscular re-education;Pain management;Patient/family education;Stair training;Therapeutic Activities;Therapeutic Exercise;UE/LE Strength taining/ROM;UE/LE Coordination activities;Wheelchair propulsion/positioning PT Transfers Anticipated Outcome(s): Mod I PT Locomotion Anticipated Outcome(s): Mod I PT  Recommendation Recommendations for Other Services: Speech consult (Pt currently with some confusion/memory deficits, but most likely due to meds.  Hopefully will resolve, but may need Speech Consult if these are long term deficits?) Follow Up Recommendations: Home health PT Patient destination: Home (To daughters home) Equipment Recommended: Rolling walker with 5" wheels Equipment Details: Pt owns a RW but it is currently in [place], [place] so patient may need a RW for use at d/c.   Skilled Therapeutic Intervention  Pt received sitting in recliner with OT present. Pt reports "I am exhausted." Pt agreeable to PT despite fatigue. Pt oriented to person, situation and place, but disoriented to time. Pt confused and appeared to have memory deficits throughout session, but able to recognize, so could possibly be due to pain meds. Pt reports increased pain, but does not want to take any more medication due to increased confusion. Discussed with patient importance of keeping pain under control for mobility and possibly taking a lower strength medication. Pt able to tolerate sit to stand/stand to sit transfers with stand pivot to the chair, requiring mod A and cues for sequencing and hand placement. Pt propelled wheel chair in hallways x75 feet with mod A for turning and negotiating obstacles. Pt reported dizziness in sitting. Vitals obtained and were [facility]. Symptoms resolved with rest. Pt ambulated x2 trials of 10 feet and 15 feet with RW with mod A and cues for sequencing with RW. Pt with noted fatigue at end of each trial. Stairs deferred at this time due to reports of dizziness and fatigue. Pt returned to room and assisted to recliner chair. Quick release belt in place for safety and nsg notified of reports of dizziness. All needs in place.  PT Evaluation Precautions/Restrictions Precautions Precautions: Posterior Hip;Fall Precaution Comments: Pt able to recall 2/3 precautions.  Restrictions Weight Bearing  Restrictions: Yes RLE Weight Bearing: Weight bearing as tolerated General Chart Reviewed: Yes Family/Caregiver Present: No Vital SignsTherapy Vitals Pulse Rate: 103 BP: 132/63 mmHg (After w/c propulsion, prior to ambulation. Pt reported dizziness) Patient Position, if appropriate: Sitting Oxygen Therapy SpO2: 94 % O2 Device: None (Room air) Pain Pain Assessment Pain Assessment: 0-10 Pain Score: 5  Pain Type: Acute pain;Surgical pain Pain Location: Hip Pain Orientation: Right Pain Descriptors / Indicators: Aching;Sore Pain Intervention(s): Medication (See eMAR) Home Living/Prior Functioning Home Living Available Help at Discharge: Family;Available 24 hours/day Type of Home: House (Daughters home) Home Access: Stairs to enter Technical brewer of Steps: 2 Entrance Stairs-Rails: Can reach both Home Layout: Multi-level;Able to live on main level with bedroom/bathroom;Other (Comment) (Family to arrange for patient to stay on main level at daughters home. Patients home is a one story home in [place], [place])  Lives With: Spouse;Other (Comment) (Pt lives in [place], [place] but was in [place] to visit daughter when fall occurred and will plan to d/c home to daughters home at discharge. ) Prior Function Level of Independence: Independent with basic ADLs;Independent with homemaking with ambulation;Independent with gait;Independent with transfers  Able to Take Stairs?: Yes Driving: Yes Vocation: Retired Leisure: Hobbies-yes (Comment) Comments: Pt was independent with ADLs, IADLs, driving and traveled with her husband prior to fall. Pt ambulated without an AD and was very active. Pt reports enjoyed watching TV and cooking.  Vision/Perception  Vision - History Baseline Vision: Bifocals Patient Visual Report: No change from baseline Vision - Assessment Eye Alignment: Within Functional Limits  Cognition Overall Cognitive Status: Impaired/Different from baseline Arousal/Alertness:  Awake/alert Orientation Level: Oriented to person;Oriented to place;Disoriented to time;Oriented to situation (Pt recalled date was 71. Pt unable to recall her grandchildren's names and where they go to school,  but patient able to recognize she is confused due to medicaiton) Attention: Sustained Sustained Attention: Appears intact Memory: Impaired Memory Impairment: Retrieval deficit;Decreased long term memory Decreased Long Term Memory: Verbal complex Awareness: Impaired Executive Function: Sequencing Sequencing: Impaired Safety/Judgment: Appears intact Sensation Sensation Light Touch: Appears Intact (Pt reports intermittnet numbness in RLE prior to fall due to a previous fall at an early age. ) Coordination Gross Motor Movements are Fluid and Coordinated: Yes Fine Motor Movements are Fluid and Coordinated: Yes Motor  Motor Motor: Within Functional Limits  Mobility Bed Mobility Bed Mobility: Not assessed Rolling Left: 2: Max assist Rolling Left Details: Tactile cues for weight beaing;Verbal cues for precautions/safety;Manual facilitation for placement Rolling Left Details (indicate cue type and reason):  (verbal cues for hip precautions) Left Sidelying to Sit: 2: Max assist Transfers Transfers: Yes Sit to Stand: 3: Mod assist Sit to Stand Details: Tactile cues for initiation;Tactile cues for sequencing;Visual cues for safe use of DME/AE;Verbal cues for sequencing;Verbal cues for technique;Verbal cues for precautions/safety Stand to Sit: 3: Mod assist Stand to Sit Details (indicate cue type and reason): Tactile cues for initiation;Tactile cues for sequencing;Visual cues for safe use of DME/AE;Verbal cues for sequencing;Verbal cues for technique;Verbal cues for precautions/safety Stand Pivot Transfers: 4: Min assist Stand Pivot Transfer Details: Tactile cues for initiation;Tactile cues for sequencing;Tactile cues for weight shifting;Visual cues for safe use of DME/AE;Visual  cues/gestures for precautions/safety;Verbal cues for sequencing;Verbal cues for technique;Verbal cues for precautions/safety Locomotion  Ambulation Ambulation: Yes Ambulation/Gait Assistance: 4: Min assist Ambulation Distance (Feet): 10 Feet Assistive device: Rolling walker Ambulation/Gait Assistance Details: Tactile cues for initiation;Tactile cues for sequencing;Visual cues for safe use of DME/AE;Visual cues/gestures for sequencing;Verbal cues for sequencing;Verbal cues for technique;Verbal cues for precautions/safety;Verbal cues for safe use of DME/AE Ambulation/Gait Assistance Details: Pt ambulated 10 feet x1, 15 feet x1 and 5 feet x1 with RW, requiring lengthy rest breaks in between trials Stairs / Additional Locomotion Stairs: No Wheelchair Mobility Wheelchair Mobility: Yes Wheelchair Assistance: 3: Mod assist Wheelchair Assistance Details: Tactile cues for initiation;Tactile cues for sequencing;Verbal cues for sequencing;Verbal cues for technique;Verbal cues for precautions/safety Wheelchair Propulsion: Both upper extremities Wheelchair Parts Management: Needs assistance Distance: Pt propelled wheel chair 75 feet on level surfaces using BUE, requiring min A for forward propulsion and mod A for turning and negotiating obstacles.   Trunk/Postural Assessment  Cervical Assessment Cervical Assessment: Within Functional Limits Thoracic Assessment Thoracic Assessment: Within Functional Limits Lumbar Assessment Lumbar Assessment: Within Functional Limits Postural Control Postural Control: Deficits on evaluation Postural Limitations:  (decreased hip mobility due to hi precautions)  Balance Balance Balance Assessed: Yes Static Sitting Balance Static Sitting - Balance Support: No upper extremity supported;Feet supported Static Sitting - Level of Assistance: 5: Stand by assistance Dynamic Sitting Balance Dynamic Sitting - Balance Support: No upper extremity supported;Feet  supported Dynamic Sitting - Level of Assistance: 4: Min assist Dynamic Sitting - Balance Activities: Reaching for objects Static Standing Balance Static Standing - Balance Support: Bilateral upper extremity supported Static Standing - Level of Assistance: 4: Min assist Dynamic Standing Balance Dynamic Standing - Balance Support: Bilateral upper extremity supported Dynamic Standing - Level of Assistance: 3: Mod assist Extremity Assessment      RLE Assessment RLE Assessment: Exceptions to [facility] RLE AROM (degrees) Overall AROM Right Lower Extremity: Within functional limits for tasks assessed RLE Strength RLE Overall Strength: Within Functional Limits for tasks assessed Right Hip Flexion: 3-/5 Right Hip ABduction: 3-/5 Right Hip ADduction: 3-/5 Right Knee Flexion: 3+/5 Right  Knee Extension: 3+/5 Right Ankle Dorsiflexion: 4/5 Right Ankle Plantar Flexion: 4/5 LLE Assessment LLE Assessment: Within Functional Limits  FIM:  FIM - Control and instrumentation engineer Devices: Walker;Arm rests Bed/Chair Transfer: 2: Chair or W/C > Bed: Max A (lift and lower assist);2: Bed > Chair or W/C: Max A (lift and lower assist) FIM - Locomotion: Wheelchair Distance: Pt propelled wheel chair 75 feet on level surfaces using BUE, requiring min A for forward propulsion and mod A for turning and negotiating obstacles.  Locomotion: Wheelchair: 2: Travels 50 - 149 ft with moderate assistance (Pt: 50 - 74%) FIM - Locomotion: Ambulation Locomotion: Ambulation Assistive Devices: Administrator Ambulation/Gait Assistance: 4: Min assist Locomotion: Ambulation: 1: Travels less than 50 ft with minimal assistance (Pt.>75%) FIM - Locomotion: Stairs Locomotion: Stairs: 0: Activity did not occur   Refer to Care Plan for Long Term Goals  Recommendations for other services: None  Discharge Criteria: Patient will be discharged from PT if patient refuses treatment 3 consecutive times without medical  reason, if treatment goals not met, if there is a change in medical status, if patient makes no progress towards goals or if patient is discharged from hospital.  The above assessment, treatment plan, treatment alternatives and goals were discussed and mutually agreed upon: by patient  Lillia Corporal R 03/12/2014, 3:50 PM

## 2014-03-12 NOTE — Evaluation (Signed)
Occupational Therapy Assessment and Plan  Patient Details  Name: [name] MRN: [id number] Date of Birth: [birth year]  OT Diagnosis: altered mental status, muscle weakness (generalized) and pain in joint Rehab Potential: Rehab Potential: [place] ELOS: 14-18 days   Today's Date: 03/12/2014 Time: 0800-0900  1st session Time Calculation (min): 60 min  Problem List:  Patient Active Problem List   Diagnosis Date Noted  . Femoral neck fracture 03/11/2014  . Fracture of femoral neck, right 03/08/2014  . Diabetes mellitus 03/08/2014  . Leucocytosis 03/08/2014  . Elevated blood pressure 03/08/2014    Past Medical History:  Past Medical History  Diagnosis Date  . Diabetes mellitus without complication   . Hyperlipidemia   . Sleep apnea   . Psoriasis   . Fracture of femoral neck, right 03/08/2014   Past Surgical History:  Past Surgical History  Procedure Laterality Date  . Knee surgry    . Breast biopsy    . Hip arthroplasty Right 03/08/2014    Procedure: ARTHROPLASTY BIPOLAR HIP; RIGHT;  Surgeon: Johnny Bridge, MD;  Location: [facility];  Service: Orthopedics;  Laterality: Right;    Assessment & Plan Clinical Impression: HPI: [name] is a 80 y.o. right-handed female with history of diabetes mellitus with peripheral neuropathy. Patient independent prior to admission living with her husband in [place] and was visiting with plans to move to [place] to be closer to family. Admitted 03/08/2014 after a fall when she tripped over her. landing on her right hip without loss of consciousness. X-rays and imaging revealed displaced right femoral neck fracture. Underwent right arthroplasty bipolar hip 03/08/2014 per Dr. Mardelle Matte.  Weightbearing as tolerated right lower extremity with posterior hip precautions. Postoperative pain management. Placed on Lovenox for DVT prophylaxis. Acute blood loss anemia 9.6 and monitored. Physical and occupational therapy evaluations completed with  recommendations for physical medicine rehabilitation consult.  .  Patient transferred to [facility] on 03/11/2014 .    Patient currently requires max with basic self-care skills secondary to muscle weakness.  Prior to hospitalization, patient could complete BADL with independent .  Patient will benefit from skilled intervention to increase independence with basic self-care skills and increase level of independence with iADL prior to discharge home with care partner.  Anticipate patient will require 24 hour supervision and follow up home health.  OT - End of Session Activity Tolerance: Tolerates 10 - 20 min activity with multiple rests Endurance Deficit: Yes Endurance Deficit Description: Pt fatigues quickly with activity. Pt reported dizziness with upright activity. In sitting after x1 tsf and w/c propulsion, BP; 132/63, HR: 102, O2: 93%. Pt reported increase in dizziness with ambulation, BP: 141/67, HR: 106 BPM. Symptoms resolve with rest.  OT Assessment Rehab Potential: Good OT Patient demonstrates impairments in the following area(s): Balance;Cognition;Endurance;Motor;Safety;Pain OT Basic ADL's Functional Problem(s): Grooming;Bathing;Dressing;Toileting OT Advanced ADL's Functional Problem(s): Simple Meal Preparation OT Transfers Functional Problem(s): Toilet;Tub/Shower OT Additional Impairment(s): None OT Plan OT Intensity: Minimum of 1-2 x/day, 45 to 90 minutes OT Frequency: 5 out of 7 days OT Duration/Estimated Length of Stay: 14-18 days OT Treatment/Interventions: Teacher, English as a foreign language;Discharge planning;Functional mobility training;Pain management;Patient/family education;Psychosocial support;Self Care/advanced ADL retraining;Therapeutic Activities;Therapeutic Exercise;UE/LE Strength taining/ROM OT Self Feeding Anticipated Outcome(s): independent OT Basic Self-Care Anticipated Outcome(s): supervision OT Toileting Anticipated Outcome(s): supervision OT  Bathroom Transfers Anticipated Outcome(s): supervision OT Recommendation Patient destination: Home Follow Up Recommendations: Home health OT;24 hour supervision/assistance Equipment Recommended: To be determined   Skilled Therapeutic Intervention Time:  1330-1400  (30 min)  2nd session  Pain:  3/10 right hip and knee Individual session Addressed toilet transfers to en 1 over toilet in bathroom.  Educated pt on technique.  Pt verbalized 2/3 hip precautions.   Went over wc parts. Pt. Transferred from recliner to wc with mod assist.  Went from wc to toilet with mod assist.  Total assist with pants but did self cleaning.  Pt. Transferred back to recliner and left with P.T. For next session.    OT Evaluation Precautions/Restrictions  Precautions Precautions: Posterior Hip;Fall Precaution Booklet Issued: Yes (comment) Precaution Comments: Pt able to recall 2/3 precautions.  Restrictions Weight Bearing Restrictions: Yes RLE Weight Bearing: Weight bearing as tolerated    Vital Signs Oxygen Therapy SpO2: 91 % O2 Device: None (Room air) Pain Pain Assessment Pain Score: 5  Pain Location: Hip Pain Orientation: Right Pain Intervention(s): Medication (See eMAR) Home Living/Prior Walker expects to be discharged to:: Private residence Living Arrangements: Spouse/significant other Available Help at Discharge: Family;Available 24 hours/day Type of Home: House Home Access: Stairs to enter [employer] of Steps: 2 Entrance Stairs-Rails: Can reach both Home Layout: Multi-level;Able to live on main level with bedroom/bathroom;Other (Comment) Alternate Level Stairs-Number of Steps: 4  Lives With: Spouse;Other (Comment) IADL History Homemaking Responsibilities:  (fixes breakfast; ) Current License: Yes Mode of Transportation: Car Occupation: Retired Leisure and Hobbies:  (goes to drug store; Economist; goes to lunch; sandwich ) Prior  Function Level of Independence: Independent with basic ADLs;Independent with homemaking with ambulation;Independent with gait;Independent with transfers Meal Prep: Other (comment) (independent)  Able to Take Stairs?: Yes Driving: Yes Vocation: Retired Leisure: Hobbies-yes (Comment) (tv) Comments: Pt was independent with ADLs, IADLs, driving and traveled with her husband prior to fall. Pt ambulated without an AD and was very active. Pt reports enjoyed watching TV and cooking.  ADL   Vision/Perception  Vision - History Baseline Vision: Bifocals Patient Visual Report: No change from baseline Vision - Assessment Eye Alignment: Within Functional Limits Perception Perception: Within Functional Limits Praxis Praxis: Intact  Cognition Overall Cognitive Status: Impaired/Different from baseline Arousal/Alertness: Awake/alert Orientation Level: Oriented to person;Oriented to place;Disoriented to time;Oriented to situation Attention: Sustained Sustained Attention: Appears intact Memory: Impaired Memory Impairment: Retrieval deficit;Decreased long term memory Decreased Long Term Memory: Verbal complex Awareness: Impaired Awareness Impairment: Intellectual impairment Problem Solving: Impaired Executive Function: Sequencing Sequencing: Impaired Sequencing Impairment: Verbal complex;Functional complex Safety/Judgment: Appears intact Sensation Sensation Light Touch: Appears Intact Coordination Gross Motor Movements are Fluid and Coordinated: Yes Fine Motor Movements are Fluid and Coordinated: Yes Motor  Motor Motor: Within Functional Limits Mobility  Bed Mobility Bed Mobility: Rolling Right;Right Sidelying to Sit;Sitting - Scoot to [employer] of Bed;Supine to Sit Rolling Right: 2: Max assist Rolling Right Details: Manual facilitation for placement;Manual facilitation for weight bearing Rolling Left: 2: Max assist Rolling Left Details: Tactile cues for weight beaing;Verbal cues for  precautions/safety;Manual facilitation for placement Left Sidelying to Sit: 2: Max assist Supine to Sit: 2: Max assist Supine to Sit Details: Manual facilitation for placement;Verbal cues for technique;Verbal cues for sequencing Transfers Sit to Stand: 3: Mod assist Sit to Stand Details: Tactile cues for initiation;Tactile cues for sequencing;Visual cues for safe use of DME/AE;Verbal cues for sequencing;Verbal cues for technique;Verbal cues for precautions/safety Stand to Sit: 3: Mod assist Stand to Sit Details (indicate cue type and reason): Tactile cues for initiation;Tactile cues for sequencing;Visual cues for safe use of DME/AE;Verbal cues for sequencing;Verbal cues for technique;Verbal cues for precautions/safety  Trunk/Postural Assessment  Cervical Assessment Cervical  Assessment: Within Functional Limits Thoracic Assessment Thoracic Assessment: Within Functional Limits Lumbar Assessment Lumbar Assessment: Within Functional Limits Postural Control Postural Control: Deficits on evaluation Postural Limitations:  (hip restrictions)  Balance Balance Balance Assessed:  (see PT eval) Static Sitting Balance Static Sitting - Balance Support: No upper extremity supported;Feet supported Static Sitting - Level of Assistance: 5: Stand by assistance Dynamic Sitting Balance Dynamic Sitting - Balance Support: No upper extremity supported;Feet supported Dynamic Sitting - Level of Assistance: 4: Min assist Dynamic Sitting - Balance Activities: Reaching for objects Sitting balance - Comments: Sits EOB with Min assist Static Standing Balance Static Standing - Balance Support: Bilateral upper extremity supported Static Standing - Level of Assistance: 3: Mod assist Dynamic Standing Balance Dynamic Standing - Balance Support: Bilateral upper extremity supported Dynamic Standing - Level of Assistance: 3: Mod assist Extremity/Trunk Assessment RUE Assessment RUE Assessment: Within Functional  Limits LUE Assessment LUE Assessment: Within Functional Limits  FIM:  FIM - Eating Eating Activity: 7: Complete independence:no helper FIM - Grooming Grooming Steps: Wash, rinse, dry face;Wash, rinse, dry hands Grooming: 3: Patient completes 2 of 4 or 3 of 5 steps FIM - Bathing Bathing Steps Patient Completed: Chest;Right Arm;Left Arm;Abdomen Bathing: 2: Max-Patient completes 3-4 16f10 parts or 25-49% FIM - Upper Body Dressing/Undressing Upper body dressing/undressing steps patient completed: Pull shirt over trunk;Put head through opening of pull over shirt/dress;Thread/unthread left sleeve of pullover shirt/dress;Thread/unthread right sleeve of pullover shirt/dresss Upper body dressing/undressing: 4: Min-Patient completed 75 plus % of tasks FIM - Lower Body Dressing/Undressing Lower body dressing/undressing: 2: Max-Patient completed 25-49% of tasks FIM - Toileting Toileting: 1: Total-Patient completed zero steps, helper did all 3 FIM - BControl and instrumentation engineerDevices: Bed rails Bed/Chair Transfer: 2: Chair or W/C > Bed: Max A (lift and lower assist);2: Bed > Chair or W/C: Max A (lift and lower assist) FIM - TRadio producerDevices: Elevated toilet seat Toilet Transfers: 2-To toilet/BSC: Max A (lift and lower assist);2-From toilet/BSC: Max A (lift and lower assist) FIM - Tub/Shower Transfers Tub/shower Transfers: 0-Activity did not occur or was simulated   Refer to Care Plan for Long Term Goals  Recommendations for other services: None  Discharge Criteria: Patient will be discharged from OT if patient refuses treatment 3 consecutive times without medical reason, if treatment goals not met, if there is a change in medical status, if patient makes no progress towards goals or if patient is discharged from hospital.  The above assessment, treatment plan, treatment alternatives and goals were discussed and mutually agreed upon: by  patient  ELisa Roca3/21/2015, 6:22 PM

## 2014-03-12 NOTE — Progress Notes (Signed)
Physical Therapy Session Note  Patient Details  Name: [name] MRN: [id number] Date of Birth: 12/05/1927  Today's Date: 03/12/2014 Time: [id number] Time Calculation (min): 45 min  Short Term Goals: Week 1:  PT Short Term Goal 1 (Week 1): Pt will be able to complete transfers from various level surfaces/heights with min A to promote increased functional moiblity.  PT Short Term Goal 2 (Week 1): Pt will be able to ambulate 50 feet using the LRAD in order to promote functional mobility within the home, allowing pt to ambulate to the bathroom.  PT Short Term Goal 3 (Week 1): Pt will be able to tolerate 10-15 reps of LE ther ex to promote increased strength and ROM of BLE.  PT Short Term Goal 4 (Week 1): Pt will be able to recall 3/3 hip precautions to promote safety with all mobility upon discharge.  PT Short Term Goal 5 (Week 1): Pt will be able to propel whele chair 150 feet at supervision level to promote increased functional indpendence in the home and community.   Therapy Documentation Precautions:  Precautions Precautions: Posterior Hip;Fall Precaution Booklet Issued: Yes (comment) Precaution Comments: Pt able to recall 2/3 precautions.  Restrictions Weight Bearing Restrictions: Yes RLE Weight Bearing: Weight bearing as tolerated Pain: R hip 7/10 (patient reports that she just received Tylenol and that pain is subsiding a bit)  Therapeutic Activity:(15') Patient sitting in recliner chair with foot rest elevated. Transfer training sit->stand using RW with Mod-assist, stand->sit with min-assist, sit->supine with Mod/Max-assist, sc[facility] to [facility] using overhead trapeze with Mod-Assist. Therapeutic Exercise:(15') B LE's in supine including ankle pumps, quad sets, gluteals sets, review of THA posterior precautions with patient unable to recite with prompting Gait Training:(15') using RW x 10 steps with S/min-assist, patient taking small shuffling steps.  See FIM for current functional  status  Therapy/Group: Individual Therapy  Rex KrasLUNDGREN, Jedrick Hutcherson J 03/12/2014, 4:44 PM

## 2014-03-13 ENCOUNTER — Inpatient Hospital Stay (HOSPITAL_COMMUNITY): Payer: Medicare Other

## 2014-03-13 LAB — GLUCOSE, CAPILLARY
GLUCOSE-CAPILLARY: 183 mg/dL — AB (ref 70–99)
GLUCOSE-CAPILLARY: 231 mg/dL — AB (ref 70–99)
GLUCOSE-CAPILLARY: 189 mg/dL — AB (ref 70–99)
Glucose-Capillary: 178 mg/dL — ABNORMAL HIGH (ref 70–99)

## 2014-03-13 MED ORDER — METFORMIN HCL 500 MG PO TABS
500.0000 mg | ORAL_TABLET | Freq: Two times a day (BID) | ORAL | Status: DC
  Administered 2014-03-13 – 2014-03-19 (×13): 500 mg via ORAL
  Filled 2014-03-13 (×19): qty 1

## 2014-03-13 NOTE — Progress Notes (Signed)
Physical Therapy Session Note  Patient Details  Name: [name] MRN: [id number] Date of Birth: 06/23/1927  Today's Date: 03/13/2014 Time: [id number] Time Calculation (min): 30 min  Short Term Goals: Week 1:  PT Short Term Goal 1 (Week 1): Pt will be able to complete transfers from various level surfaces/heights with min A to promote increased functional moiblity.  PT Short Term Goal 2 (Week 1): Pt will be able to ambulate 50 feet using the LRAD in order to promote functional mobility within the home, allowing pt to ambulate to the bathroom.  PT Short Term Goal 3 (Week 1): Pt will be able to tolerate 10-15 reps of LE ther ex to promote increased strength and ROM of BLE.  PT Short Term Goal 4 (Week 1): Pt will be able to recall 3/3 hip precautions to promote safety with all mobility upon discharge.  PT Short Term Goal 5 (Week 1): Pt will be able to propel whele chair 150 feet at supervision level to promote increased functional indpendence in the home and community.   Skilled Therapeutic Interventions/Progress Updates:  Pt received sitting in w/c in room and agreeable to therapy. Pt session focused on gait training and transfers with safety awareness. Pt ambulated 20 feet x1, 30 feet x1 and 45 feet x1 with RW and min A with cues for sequencing, upright posture and overall safety awareness with RW. Pt able to complete sit to stand/stand to sit transfers with min A, requiring cues for proper hand placement and weight shifting. Pt returned to room and assist into supine per pt request. Pt able to complete sit to supine tsf with  Min A for RLE assist. Pt position in L sidelying with a pillow between the legs and pt sleeping before PT left the room. Bed exit on and all needs in reach.   Therapy Documentation Precautions:  Precautions Precautions: Posterior Hip;Fall Precaution Booklet Issued: Yes (comment) Precaution Comments: Pt able to recall 2/3 precautions.  Restrictions Weight Bearing  Restrictions: Yes RLE Weight Bearing: Weight bearing as tolerated  Pain: Pain Assessment Pain Assessment: 0-10 Pain Score: 7  Pain Type: Acute pain Pain Location: Hip Pain Orientation: Right Pain Descriptors / Indicators: Aching Pain Onset: With Activity Patients Stated Pain Goal: 2 Pain Intervention(s): Medication (See eMAR);Repositioned   See FIM for current functional status  Therapy/Group: Individual Therapy  Dvante Hands R 03/13/2014, 3:50 PM

## 2014-03-13 NOTE — Progress Notes (Signed)
[facility] PHYSICAL MEDICINE & REHABILITATION     PROGRESS NOTE    Subjective/Complaints: Had a good day yesterday. "worked hard". Slept well again. A 12 point review of systems has been performed and if not noted above is otherwise negative.   Objective: Vital Signs: Blood pressure 160/81, pulse 93, temperature 98.1 F (36.7 C), temperature source Oral, resp. rate 18, height 5\' 3"  (1.6 m), weight 76.204 kg (168 lb), SpO2 93.00%. No results found.  Recent Labs  03/11/14 0455  WBC 15.0*  HGB 9.6*  HCT 28.4*  PLT 204    Recent Labs  03/11/14 2040  CREATININE 0.74   CBG (last 3)   Recent Labs  03/12/14 1700 03/12/14 2130 03/13/14 0722  GLUCAP 165* 150* 183*    Wt Readings from Last 3 Encounters:  03/11/14 76.204 kg (168 lb)  03/08/14 73.483 kg (162 lb)  03/08/14 73.483 kg (162 lb)    Physical Exam:  Constitutional: She is oriented to person, place, and time.  HENT: oral mucosa pink and moist, dentition fair  Head: Normocephalic.  Eyes: EOM are normal.  Neck: Normal range of motion. Neck supple. No thyromegaly present.  Cardiovascular: Normal rate and regular rhythm. No murmurs, rubs, or gallops  Respiratory: Effort normal and breath sounds normal. No respiratory distress. No wheezes, rales, or rhonchi. Wearing [place] oxygen  GI: Soft. Bowel sounds are normal. She exhibits no distension.  Neurological: She is alert and oriented to person, place, and time.  Follows full commands. CN exam intact. Motor 5/5 bilateral UE. RHF 2-, KE 2+,R ankle 4/5. LLE 4/5 HF, KE and 5/5 at ankle. No sensory deficits. Cognitively appropriate with good insight and awareness.  Skin:  Hip incision clean with minimal drainage, foam dressing in place--stable   Assessment/Plan: 1. Functional deficits secondary to right femoral neck fracture s/p bipolar hip replacement which require 3+ hours per day of interdisciplinary therapy in a comprehensive inpatient rehab setting. Physiatrist is  providing close team supervision and 24 hour management of active medical problems listed below. Physiatrist and rehab team continue to assess barriers to discharge/monitor patient progress toward functional and medical goals.  Reviewed hip precautions with patient  FIM: FIM - Bathing Bathing Steps Patient Completed: Chest;Right Arm;Left Arm;Abdomen Bathing: 2: Max-Patient completes 3-4 45f 10 parts or 25-49%  FIM - Upper Body Dressing/Undressing Upper body dressing/undressing steps patient completed: Pull shirt over trunk;Put head through opening of pull over shirt/dress;Thread/unthread left sleeve of pullover shirt/dress;Thread/unthread right sleeve of pullover shirt/dresss Upper body dressing/undressing: 4: Min-Patient completed 75 plus % of tasks FIM - Lower Body Dressing/Undressing Lower body dressing/undressing: 2: Max-Patient completed 25-49% of tasks  FIM - Toileting Toileting: 1: Total-Patient completed zero steps, helper did all 3  FIM - Diplomatic Services operational officer Devices: Elevated toilet seat Toilet Transfers: 2-To toilet/BSC: Max A (lift and lower assist);2-From toilet/BSC: Max A (lift and lower assist)  FIM - Banker Devices: Bed rails Bed/Chair Transfer: 2: Chair or W/C > Bed: Max A (lift and lower assist);2: Bed > Chair or W/C: Max A (lift and lower assist)  FIM - Locomotion: Wheelchair Distance: Pt propelled wheel chair 75 feet on level surfaces using BUE, requiring min A for forward propulsion and mod A for turning and negotiating obstacles.  Locomotion: Wheelchair: 2: Travels 50 - 149 ft with moderate assistance (Pt: 50 - 74%) FIM - Locomotion: Ambulation Locomotion: Ambulation Assistive Devices: Designer, industrial/product Ambulation/Gait Assistance: 4: Min assist Locomotion: Ambulation: 1: Travels less than  50 ft with minimal assistance (Pt.>75%)  Comprehension Comprehension Mode: Auditory;Visual Comprehension:  4-Understands basic 75 - 89% of the time/requires cueing 10 - 24% of the time  Expression Expression Mode: Verbal Expression: 5-Expresses basic needs/ideas: With extra time/assistive device  Social Interaction Social Interaction: 5-Interacts appropriately 90% of the time - Needs monitoring or encouragement for participation or interaction.  Problem Solving Problem Solving: 5-Solves basic 90% of the time/requires cueing < 10% of the time  Memory Memory: 3-Recognizes or recalls 50 - 74% of the time/requires cueing 25 - 49% of the time  Medical Problem List and Plan:  1. Displaced right femoral neck fracture. Status post right bipolar hip arthroplasty. Weightbearing as tolerated posterior hip precautions  2. DVT Prophylaxis/Anticoagulation: Subcutaneous Lovenox. Monitor for any bleeding episodes. Check vascular study  .  3. Pain Management: Hydrocodone and Robaxin as needed. Monitor with increased mobility  4. Neuropsych: This patient is capable of making decisions on her own behalf.  5. Acute blood loss anemia. Followup CBC on Monday. Fe++ supp, diet. No signs of bleeding  6. Diabetes mellitus with peripheral neuropathy. Blood sugars showing improvement over the last 24hr.    -on Amaryl 2 mg daily.    -patient also on Glucophage 500 mg twice daily prior to admission---will resume 7. Hyperlipidemia. Zocor  8. GERD. Protonix  LOS (Days) 2 A FACE TO FACE EVALUATION WAS PERFORMED  [name] T 03/13/2014 9:23 AM

## 2014-03-14 ENCOUNTER — Inpatient Hospital Stay (HOSPITAL_COMMUNITY): Payer: Medicare Other | Admitting: Physical Therapy

## 2014-03-14 ENCOUNTER — Inpatient Hospital Stay (HOSPITAL_COMMUNITY): Payer: Medicare Other

## 2014-03-14 ENCOUNTER — Encounter (HOSPITAL_COMMUNITY): Payer: Medicare Other

## 2014-03-14 DIAGNOSIS — S72009A Fracture of unspecified part of neck of unspecified femur, initial encounter for closed fracture: Secondary | ICD-10-CM

## 2014-03-14 DIAGNOSIS — W010XXA Fall on same level from slipping, tripping and stumbling without subsequent striking against object, initial encounter: Secondary | ICD-10-CM

## 2014-03-14 LAB — COMPREHENSIVE METABOLIC PANEL
ALK PHOS: 119 U/L — AB (ref 39–117)
ALT: 15 U/L (ref 0–35)
AST: 17 U/L (ref 0–37)
Albumin: 2.3 g/dL — ABNORMAL LOW (ref 3.5–5.2)
BUN: 13 mg/dL (ref 6–23)
CO2: 25 meq/L (ref 19–32)
Calcium: 9.4 mg/dL (ref 8.4–10.5)
Chloride: 101 mEq/L (ref 96–112)
Creatinine, Ser: 0.67 mg/dL (ref 0.50–1.10)
GFR, EST NON AFRICAN AMERICAN: 79 mL/min — AB (ref >90)
Glucose, Bld: 164 mg/dL — ABNORMAL HIGH (ref 70–99)
POTASSIUM: 4.7 meq/L (ref 3.7–5.3)
SODIUM: 139 meq/L (ref 137–147)
Total Bilirubin: 0.6 mg/dL (ref 0.3–1.2)
Total Protein: 6.3 g/dL (ref 6.0–8.3)

## 2014-03-14 LAB — GLUCOSE, CAPILLARY
GLUCOSE-CAPILLARY: 181 mg/dL — AB (ref 70–99)
Glucose-Capillary: 197 mg/dL — ABNORMAL HIGH (ref 70–99)
Glucose-Capillary: 140 mg/dL — ABNORMAL HIGH (ref 70–99)
Glucose-Capillary: 177 mg/dL — ABNORMAL HIGH (ref 70–99)
Glucose-Capillary: 138 mg/dL — ABNORMAL HIGH (ref 70–99)

## 2014-03-14 LAB — CBC WITH DIFFERENTIAL/PLATELET
Basophils Absolute: 0.1 10*3/uL (ref 0.0–0.1)
Basophils Relative: 1 % (ref 0–1)
Eosinophils Absolute: 0.4 10*3/uL (ref 0.0–0.7)
Eosinophils Relative: 4 % (ref 0–5)
HCT: 29 % — ABNORMAL LOW (ref 36.0–46.0)
Hemoglobin: 9.8 g/dL — ABNORMAL LOW (ref 12.0–15.0)
LYMPHS ABS: 1.7 10*3/uL (ref 0.7–4.0)
LYMPHS PCT: 15 % (ref 12–46)
MCH: 32.8 pg (ref 26.0–34.0)
MCHC: 33.8 g/dL (ref 30.0–36.0)
MCV: 97 fL (ref 78.0–100.0)
Monocytes Absolute: 1.3 10*3/uL — ABNORMAL HIGH (ref 0.1–1.0)
Monocytes Relative: 11 % (ref 3–12)
NEUTROS ABS: 7.9 10*3/uL — AB (ref 1.7–7.7)
NEUTROS PCT: 70 % (ref 43–77)
PLATELETS: 294 10*3/uL (ref 150–400)
RBC: 2.99 MIL/uL — AB (ref 3.87–5.11)
RDW: 13.4 % (ref 11.5–15.5)
WBC: 11.4 10*3/uL — AB (ref 4.0–10.5)

## 2014-03-14 MED ORDER — TRAMADOL HCL 50 MG PO TABS
50.0000 mg | ORAL_TABLET | Freq: Four times a day (QID) | ORAL | Status: DC | PRN
  Administered 2014-03-14 – 2014-03-18 (×8): 50 mg via ORAL
  Filled 2014-03-14 (×9): qty 1

## 2014-03-14 NOTE — Progress Notes (Signed)
Social Work Assessment and Plan Social Work Assessment and Plan  Patient Details  Name: [name] MRN: [id number] Date of Birth: 01/20/1927  Today's Date: 03/14/2014  Problem List:  Patient Active Problem List   Diagnosis Date Noted  . Femoral neck fracture 03/11/2014  . Fracture of femoral neck, right 03/08/2014  . Diabetes mellitus 03/08/2014  . Leucocytosis 03/08/2014  . Elevated blood pressure 03/08/2014   Past Medical History:  Past Medical History  Diagnosis Date  . Diabetes mellitus without complication   . Hyperlipidemia   . Sleep apnea   . Psoriasis   . Fracture of femoral neck, right 03/08/2014   Past Surgical History:  Past Surgical History  Procedure Laterality Date  . Knee surgry    . Breast biopsy    . Hip arthroplasty Right 03/08/2014    Procedure: ARTHROPLASTY BIPOLAR HIP; RIGHT;  Surgeon: Eulas PostJoshua P Landau, MD;  Location: [facility] OR;  Service: Orthopedics;  Laterality: Right;   Social History:  reports that she has never smoked. She has never used smokeless tobacco. She reports that she does not drink alcohol or use illicit drugs.  Family / Support Systems Marital Status: Married Patient Roles: Spouse;Parent Spouse/Significant Other: [phone]-home  [phone] Children: Connie-daughter  [phone]-home  [phone]-cell Other Supports: Another daughter in [place]-coming tomorrow Anticipated Caregiver: Husband and daughter Ability/Limitations of Caregiver: Husband can be there but not provide physical care Caregiver Availability: 24/7 Family Dynamics: Close knit family who are supportive of one another.  They were here for a visit when pt fell over the dog bed.  She reports: " We don't bounce like we used too."  Pt has a good attitude and is ready to work and get home soon.  Social History Preferred language: English Religion: None Cultural Background: No issues Education: High School Read: Yes Write: Yes Employment Status: Retired Insurance account managerLegal  Hisotry/Current Legal Issues: No issues Guardian/Conservator: None-according to MD pt is capable of making her own decisions while here.   Abuse/Neglect Physical Abuse: Denies Verbal Abuse: Denies Sexual Abuse: Denies Exploitation of patient/patient's resources: Denies Self-Neglect: Denies  Emotional Status Pt's affect, behavior adn adjustment status: Pt is motivated to improve she has always been one to be independent and cared for others not the other way around.  She has strong family support and feels she will do well, it will take time to heal and she needs to be patient. Recent Psychosocial Issues: Other health issues-blood sugars are high now since surgery.  MD is managing them and beginning to come down. Pyschiatric History: No issues/history  deferred depression screen due to pt doing well and is able to vocalize her concerns.  WIll monitor her coping and intervene if any issues arise. Substance Abuse History: No issues  Patient / Family Perceptions, Expectations & Goals Pt/Family understanding of illness & functional limitations: Pt and daughter are able to explain pt's condition and WB issues.  Pt's daughter is a PT and works at [employer], so works with pt's like Mom daily.  Pt needs time to heal and recover. Premorbid pt/family roles/activities: Wife, Mother, Retiree, Home owner, Church member, etc Anticipated changes in roles/activities/participation: resume Pt/family expectations/goals: Pt states: " I want to be able to move around on my own by the time I leave here."  Daughter states: " I will do what I can and my Dad will help, but it would be great if she was walking with a walker."  [employer] Community Agencies: None Premorbid Home Care/DME Agencies: None Transportation available  at discharge: family  Discharge Planning Living Arrangements: Spouse/significant other Support Systems: Spouse/significant other;Children;Friends/neighbors;Church/faith  community Type of Residence: Private residence Insurance Resources: Administrator (specify) Building services engineer) Financial Resources: Restaurant manager, fast food Screen Referred: No Living Expenses: Lives with family Money Management: Spouse;Patient Does the patient have any problems obtaining your medications?: No Home Management: Self Patient/Family Preliminary Plans: Plans to go to daughter's home upon dishcarge from here and then eventually back to her home.  She and husband can stay with daughter as long as needed.  Will work on discharge plans and coordination of services. Social Work Anticipated Follow Up Needs: HH/OP  Clinical Impression Pleasant female who is motivated to improve and regain her independence.  She is willing to do what it takes to make her goals.  Her daughter and husband are supportive and will assist. Work toward discharge plans.  Husband not coming up as much due to too long of walk to pt's room.  Lucy Chris 03/14/2014, 11:18 AM

## 2014-03-14 NOTE — Progress Notes (Signed)
PHYSICAL MEDICINE & REHABILITATION     PROGRESS NOTE 80 y.o. right-handed female with history of diabetes mellitus with peripheral neuropathy. Patient independent prior to admission living with her husband in [place] and was visiting with plans to move to [place] to be closer to family. Admitted 03/08/2014 after a fall when she tripped over her. landing on her right hip without loss of consciousness. X-rays and imaging revealed displaced right femoral neck fracture. Underwent right arthroplasty bipolar hip 03/08/2014 per Dr. Dion SaucierLandau.  Weightbearing as tolerated right lower extremity with posterior hip precautions. Postoperative pain management. Placed on Lovenox for DVT prophylaxis. Acute blood loss anemia 9.6 and monitored    Subjective/Complaints: Good BM yesterday.  RN notes pt feels foggy with hydrocodone Slept well again. A 12 point review of systems has been performed and if not noted above is otherwise negative.   Objective: Vital Signs: Blood pressure 128/76, pulse 85, temperature 97.4 F (36.3 C), temperature source Oral, resp. rate 16, height 5\' 3"  (1.6 m), weight 76.204 kg (168 lb), SpO2 94.00%. No results found.  Recent Labs  03/14/14 0710  WBC 11.4*  HGB 9.8*  HCT 29.0*  PLT 294    Recent Labs  03/11/14 2040 03/14/14 0710  NA  --  139  K  --  4.7  CL  --  101  GLUCOSE  --  164*  BUN  --  13  CREATININE 0.74 0.67  CALCIUM  --  9.4   CBG (last 3)   Recent Labs  03/13/14 1636 03/13/14 2031 03/14/14 0708  GLUCAP 189* 178* 140*    Wt Readings from Last 3 Encounters:  03/11/14 76.204 kg (168 lb)  03/08/14 73.483 kg (162 lb)  03/08/14 73.483 kg (162 lb)    Physical Exam:  Constitutional: She is oriented to person, place, and time.  HENT: oral mucosa pink and moist, dentition fair  Head: Normocephalic.  Eyes: EOM are normal.  Neck: Normal range of motion. Neck supple. No thyromegaly present.  Cardiovascular: Normal rate and regular  rhythm. No murmurs, rubs, or gallops  Respiratory: Effort normal and breath sounds normal. No respiratory distress. No wheezes, rales, or rhonchi. Wearing [place] oxygen  GI: Soft. Bowel sounds are normal. She exhibits no distension.  Neurological: She is alert and oriented to person, place, and time.  Follows full commands. CN exam intact. Motor 5/5 bilateral UE. RHF 2-, KE 2+,R ankle 4/5. LLE 4/5 HF, KE and 5/5 at ankle. No sensory deficits. Cognitively appropriate with good insight and awareness.     Assessment/Plan: 1. Functional deficits secondary to right femoral neck fracture s/p bipolar hip replacement which require 3+ hours per day of interdisciplinary therapy in a comprehensive inpatient rehab setting. Physiatrist is providing close team supervision and 24 hour management of active medical problems listed below. Physiatrist and rehab team continue to assess barriers to discharge/monitor patient progress toward functional and medical goals.    FIM: FIM - Bathing Bathing Steps Patient Completed: Chest;Right Arm;Left Arm;Abdomen Bathing: 2: Max-Patient completes 3-4 4873f 10 parts or 25-49%  FIM - Upper Body Dressing/Undressing Upper body dressing/undressing steps patient completed: Pull shirt over trunk;Put head through opening of pull over shirt/dress;Thread/unthread left sleeve of pullover shirt/dress;Thread/unthread right sleeve of pullover shirt/dresss Upper body dressing/undressing: 4: Min-Patient completed 75 plus % of tasks FIM - Lower Body Dressing/Undressing Lower body dressing/undressing: 2: Max-Patient completed 25-49% of tasks  FIM - Toileting Toileting steps completed by patient: Adjust clothing prior to toileting;Performs perineal hygiene Toileting Assistive Devices:  Grab bar or rail for support Toileting: 3: Mod-Patient completed 2 of 3 steps  FIM - Diplomatic Services operational officer Devices: Elevated toilet seat Toilet Transfers: 2-To toilet/BSC: Max A (lift  and lower assist);2-From toilet/BSC: Max A (lift and lower assist)  FIM - Press photographer Assistive Devices: Arm rests Bed/Chair Transfer: 4: Sit > Supine: Min A (steadying pt. > 75%/lift 1 leg);2: Bed > Chair or W/C: Max A (lift and lower assist);2: Chair or W/C > Bed: Max A (lift and lower assist)  FIM - Locomotion: Wheelchair Distance: Pt propelled wheel chair 75 feet on level surfaces using BUE, requiring min A for forward propulsion and mod A for turning and negotiating obstacles.  Locomotion: Wheelchair: 0: Activity did not occur FIM - Locomotion: Ambulation Locomotion: Ambulation Assistive Devices: Designer, industrial/product Ambulation/Gait Assistance: 4: Min assist Locomotion: Ambulation: 1: Travels less than 50 ft with minimal assistance (Pt.>75%) (45 feet)  Comprehension Comprehension Mode: Auditory;Visual Comprehension: 5-Follows basic conversation/direction: With no assist  Expression Expression Mode: Verbal Expression: 5-Expresses basic needs/ideas: With extra time/assistive device  Social Interaction Social Interaction: 5-Interacts appropriately 90% of the time - Needs monitoring or encouragement for participation or interaction.  Problem Solving Problem Solving: 5-Solves basic 90% of the time/requires cueing < 10% of the time  Memory Memory: 4-Recognizes or recalls 75 - 89% of the time/requires cueing 10 - 24% of the time  Medical Problem List and Plan:  1. Displaced right femoral neck fracture. Status post right bipolar hip arthroplasty. Weightbearing as tolerated posterior hip precautions  2. DVT Prophylaxis/Anticoagulation: Subcutaneous Lovenox. Monitor for any bleeding episodes. Check vascular study  .  3. Pain Management: Hydrocodone and Robaxin as needed. Monitor with increased mobility Trial tramadol 4. Neuropsych: This patient is capable of making decisions on her own behalf.  5. Acute blood loss anemia. Followup Hgb stable. Fe++ supp, diet. No  signs of bleeding  6. Diabetes mellitus with peripheral neuropathy. Blood sugars showing improvement over the last 24hr.    -on Amaryl 2 mg daily.    -patient also on Glucophage 500 mg twice daily prior to admission---will resume 7. Hyperlipidemia. Zocor  8. GERD. Protonix  LOS (Days) 3 A FACE TO FACE EVALUATION WAS PERFORMED  Erick Colace 03/14/2014 8:28 AM

## 2014-03-14 NOTE — Progress Notes (Signed)
Occupational Therapy Session Note  Patient Details  Name: [name] MRN: [id number] Date of Birth: 06/27/1927  Today's Date: 03/14/2014 Time: [id number] and 1130-1200 Time Calculation (min): 60 min and 30 min   Short Term Goals: Week 1:  OT Short Term Goal 1 (Week 1): Pt. will be supervision with grooming OT Short Term Goal 2 (Week 1): Pt. will be supervision with UB dressing OT Short Term Goal 3 (Week 1): Pt. will be supervison with LB dressing OT Short Term Goal 4 (Week 1): Pt. will be supervision with toileting OT Short Term Goal 5 (Week 1): Pt. will be min assist with shower transfer  Skilled Therapeutic Interventions/Progress Updates:    Session 1: Pt seen for ADL retraining with focus on activity tolerance, adherence to hip precautions, use of AE, and functional transfers. Pt received sitting in w/c requesting to complete toilet task. Completed stand pivot transfer w/c<>toilet with min assist and min cues for adherence to hip precautions as pt attempting to lean forward to far for reaching grab bar. Presented LH sponge at beginning of bathing session and pt required min cues to utilize as pt unable to determine how to safely reach feet. Educated and practiced with reacher and pt required multimodal cues for correct use and following precautions. Pt completed sit<>stand x3 with min assist. Pt required frequent rest breaks during therapy session d/t fatigue. Pt verbally recalled 2/3 precautions at beginning and end of session. Pt left sitting in w/c with all needs in reach.  Session 2: Pt seen for 1:1 OT session with focus on functional transfers, adherence to precautions, and activity tolerance. Pt received sitting w/c requesting to go to bathroom. Ambulated with RW and increased time to toilet demonstrating improved carryover of adherence to precautions from AM session. Pt with min assist for sit<>stand from toilet and for standing balance. Pt returned to w/c and required rest break.  Educated on sock-aid and provided demonstration. Practiced using sock-aid 3x with min cues initially for adherence to precautions then demonstrating carryover. Pt transferred to recliner chair with min assist using RW. Pt left sitting in recliner chair with all needs in reach.   Therapy Documentation Precautions:  Precautions Precautions: Posterior Hip;Fall Precaution Booklet Issued: Yes (comment) Precaution Comments: Pt able to recall 2/3 precautions.  Restrictions Weight Bearing Restrictions: Yes RLE Weight Bearing: Weight bearing as tolerated General:   Vital Signs:   Pain: Pain Assessment Pain Assessment: 0-10 Pain Score: 4  Pain Type: Acute pain Pain Location: Hip Pain Orientation: Right Pain Descriptors / Indicators: Aching Pain Frequency: Intermittent Pain Onset: With Activity Patients Stated Pain Goal: 2 Pain Intervention(s): Medication (See eMAR)  See FIM for current functional status  Therapy/Group: Individual Therapy  Daneil Danerkinson, Koralee Wedeking N 03/14/2014, 12:18 PM

## 2014-03-14 NOTE — IPOC Note (Signed)
Overall Plan of Care [facility](IPOC) Patient Details Name: Marissa FontFrances Vanderheyden MRN: [id number] DOB: 10/22/1927  Admitting Diagnosis: R hip Fx s/p R hip Hemi   Hospital Problems: Active Problems:   Femoral neck fracture     Functional Problem List: Nursing Endurance;Pain;Motor;Safety;Skin Integrity  PT Balance;Endurance;Motor;Pain;Safety  OT Balance;Cognition;Endurance;Motor;Safety;Pain  SLP    TR         Basic ADL's: OT Grooming;Bathing;Dressing;Toileting     Advanced  ADL's: OT Simple Meal Preparation     Transfers: PT Bed Mobility;Bed to Chair;Car;Furniture  OT Toilet;Tub/Shower     Locomotion: PT Ambulation;Stairs;Wheelchair Mobility     Additional Impairments: OT None  SLP        TR      Anticipated Outcomes Item Anticipated Outcome  Self Feeding independent  Swallowing      Basic self-care  supervision  Toileting  supervision   Bathroom Transfers supervision  Bowel/Bladder  Remain continent of bowel and bladder with Mod I  Transfers  Mod I  Locomotion  Mod I  Communication     Cognition     Pain  < 3  Safety/Judgment  Mod I   Therapy Plan: PT Intensity: Minimum of 1-2 x/day ,45 to 90 minutes PT Frequency: 5 out of 7 days PT Duration Estimated Length of Stay: 14-18 days OT Intensity: Minimum of 1-2 x/day, 45 to 90 minutes OT Frequency: 5 out of 7 days OT Duration/Estimated Length of Stay: 14-18 days         Team Interventions: Nursing Interventions Patient/Family Education;Pain Management;Skin Care/Wound Management  PT interventions Ambulation/gait training;Community reintegration;Discharge planning;Disease management/prevention;DME/adaptive equipment instruction;Functional mobility training;Neuromuscular re-education;Pain management;Patient/family education;Stair training;Therapeutic Activities;Therapeutic Exercise;UE/LE Strength taining/ROM;UE/LE Coordination activities;Wheelchair propulsion/positioning  OT Interventions Physiological scientistBalance/vestibular  training;DME/adaptive equipment instruction;Discharge planning;Functional mobility training;Pain management;Patient/family education;Psychosocial support;Self Care/advanced ADL retraining;Therapeutic Activities;Therapeutic Exercise;UE/LE Strength taining/ROM  SLP Interventions    TR Interventions    SW/CM Interventions      Team Discharge Planning: Destination: PT-Home (To daughters home) ,OT- Home , SLP-  Projected Follow-up: PT-Home health PT, OT-  Home health OT;24 hour supervision/assistance, SLP-  Projected Equipment Needs: PT-Rolling walker with 5" wheels, OT- To be determined, SLP-  Equipment Details: PT-Pt owns a RW but it is currently in [place] so patient may need a RW for use at d/c. , OT-  Patient/family involved in discharge planning: PT- Patient,  OT-Patient, SLP-   MD ELOS: 10-14d Medical Rehab Prognosis:  Good Assessment: 80 y.o. right-handed female with history of diabetes mellitus with peripheral neuropathy. Patient independent prior to admission living with her husband in [place] and was visiting with plans to move to [place] to be closer to family. Admitted 03/08/2014 after a fall when she tripped over her. landing on her right hip without loss of consciousness. X-rays and imaging revealed displaced right femoral neck fracture. Underwent right arthroplasty bipolar hip 03/08/2014 per Dr. Dion SaucierLandau.  Weightbearing as tolerated right lower extremity with posterior hip precautions  Now requiring 24/7 Rehab RN,MD, as well as CIR level PT, OT and SLP.  Treatment team will focus on ADLs and mobility with goals set at [facility]  See Team Conference Notes for weekly updates to the plan of care

## 2014-03-14 NOTE — Progress Notes (Signed)
Physical Therapy Session Note  Patient Details  Name: [name] MRN: [id number] Date of Birth: 05-03-1927  Today's Date: 03/14/2014 Time: 1478-2956 and 2130-8657 Time Calculation (min): 61 min and 45 min  Short Term Goals: Week 1:  PT Short Term Goal 1 (Week 1): Pt will be able to complete transfers from various level surfaces/heights with min A to promote increased functional moiblity.  PT Short Term Goal 2 (Week 1): Pt will be able to ambulate 50 feet using the LRAD in order to promote functional mobility within the home, allowing pt to ambulate to the bathroom.  PT Short Term Goal 3 (Week 1): Pt will be able to tolerate 10-15 reps of LE ther ex to promote increased strength and ROM of BLE.  PT Short Term Goal 4 (Week 1): Pt will be able to recall 3/3 hip precautions to promote safety with all mobility upon discharge.  PT Short Term Goal 5 (Week 1): Pt will be able to propel whele chair 150 feet at supervision level to promote increased functional indpendence in the home and community.   Skilled Therapeutic Interventions/Progress Updates:    Treatment Session 1: Pt received seated in w/c; reports fatigue but agreeable to session. Oriented x4 with use of visual aid (no cueing required) to orient self to date. Pt able to recall 2 of 3 R hip precautions without cueing; recalled third precaution with min demonstration cueing.  Sit<>stand from w/c with rolling walker and min guard, mod verbal/tactile cueing for safe hand placement. Performed gait x55' in controlled environment with rolling walker and min guard, mod multimodal cueing for RLE clearance with effective return demonstration.  Explained and provided [facility] assist for w/c mobility technique using bilat UE's. Pt then performed w/c mobility x75' in controlled environment with bilat UE's requiring increased time, mod A for [facility] assist during turning; mod multimodal cueing for hand placement with inconsistent carryover. Trial ended due to pt  fatigue. During rest breaks, explained and demonstrated functional implications of hip precautions (for example, sitting in low chairs would result in excessive hip flexion). Pt verbalized understanding.  Negotiated 3 (4.5") steps with bilat rails, forward-facing with step-to pattern and min A to ascend, mod A to descend; min cueing for technique, sequencing with effective within-session carryover. Following seated rest break, performed gait x70' in controlled environment with rolling walker and min A for bilat weight shifting, verbal cueing for bilat LE clearance with effective return demonstration. Session ended in pt room, where pt was left seated in w/c with quick release belt on for safety, all needs within reach, and CSW present. RN notified of increase R hip pain with final gait trial.   Treatment Session 2: Pt received seated in w/c; agreeable to therapy. Session focused on increasing stability/independence with bed mobility, functional transfers, and gait. Provided pt with paper handout of verbal explanations and illustrations of posterior hip precautions. Per pt request, sign posted on pt wall to reinforce hip precautions. Pt required verbal cueing x2 during session to avoid R hip internal rotation during standing/gait.  Pt performed gait x219' in controlled environment with rolling walker and min guard, increased time; pt with improved foot clearance bilaterally. Performed multiple supine<>sit transfers with HOB flat, no rails, using leg lifter requiring min A initially then supervision for final trial. Performed multiple sit<>stand transfers with rolling walker, close supervision, and effective between-session carryover of all cueing from AM session. Daughter present for final 10 minutes of session. Therapist departed with pt seated in bedside chair with  daughter and friend present with all needs within reach.  Therapy Documentation Precautions:  Precautions Precautions: Posterior  Hip;Fall Precaution Booklet Issued: Yes (comment) Precaution Comments: Pt able to recall 2/3 precautions.  Restrictions Weight Bearing Restrictions: Yes RLE Weight Bearing: Weight bearing as tolerated Pain: Pain Assessment Pain Assessment: 0-10 Pain Score: 4  Pain Type: Acute pain Pain Location: Hip Pain Orientation: Right Pain Descriptors / Indicators: Aching Pain Frequency: Intermittent Pain Onset: With Activity Patients Stated Pain Goal: 2 Pain Intervention(s): Medication (See eMAR) Locomotion : Ambulation Ambulation/Gait Assistance: 4: Min assist Wheelchair Mobility Distance: 75   See FIM for current functional status  Therapy/Group: Individual Therapy  Calvert CantorHobble, [name] 03/14/2014, 12:35 PM

## 2014-03-14 NOTE — Care Management Note (Signed)
[facility] Individual Statement of Services  Patient Name:  [name]  Date:  03/14/2014  Welcome to the [facility].  Our goal is to provide you with an individualized program based on your diagnosis and situation, designed to meet your specific needs.  With this comprehensive rehabilitation program, you will be expected to participate in at least 3 hours of rehabilitation therapies Monday-Friday, with modified therapy programming on the weekends.  Your rehabilitation program will include the following services:  Physical Therapy (PT), Occupational Therapy (OT), 24 hour per day rehabilitation nursing, Case Management (Social Worker), Rehabilitation Medicine, Nutrition Services and Pharmacy Services  Weekly team conferences will be held on Wednesday to discuss your progress.  Your Social Worker will talk with you frequently to get your input and to update you on team discussions.  Team conferences with you and your family in attendance may also be held.  Expected length of stay: 14-18 days Overall anticipated outcome: supervision/mod/i level  Depending on your progress and recovery, your program may change. Your Social Worker will coordinate services and will keep you informed of any changes. Your Social Worker's name and contact numbers are listed  below.  The following services may also be recommended but are not provided by the [facility]:   Driving Evaluations  Home Health Rehabiltiation Services  Outpatient Rehabilitation Services    Arrangements will be made to provide these services after discharge if needed.  Arrangements include referral to agencies that provide these services.  Your insurance has been verified to be:  Medicare & AARP Your primary doctor is:    Pertinent information will be shared with your doctor and your insurance company.  Social Worker:  Dossie DerBecky Hobby, SW [phone] or ([phone])  [phone]  Information discussed with and copy given to patient by: Lucy Chrisupree, Eyvonne Burchfield G, 03/14/2014, 10:59 AM

## 2014-03-14 NOTE — Progress Notes (Signed)
Patient information reviewed and entered into eRehab system by Bensen Chadderdon, RN, CRRN, PPS Coordinator.  Information including medical coding and functional independence measure will be reviewed and updated through discharge.     Per nursing patient was given "Data Collection Information Summary for Patients in Inpatient Rehabilitation Facilities with attached "Privacy Act Statement-Health Care Records" upon admission.  

## 2014-03-15 ENCOUNTER — Inpatient Hospital Stay (HOSPITAL_COMMUNITY): Payer: Medicare Other | Admitting: Physical Therapy

## 2014-03-15 ENCOUNTER — Inpatient Hospital Stay (HOSPITAL_COMMUNITY): Payer: Medicare Other

## 2014-03-15 DIAGNOSIS — W010XXA Fall on same level from slipping, tripping and stumbling without subsequent striking against object, initial encounter: Secondary | ICD-10-CM

## 2014-03-15 DIAGNOSIS — S72009A Fracture of unspecified part of neck of unspecified femur, initial encounter for closed fracture: Secondary | ICD-10-CM

## 2014-03-15 LAB — GLUCOSE, CAPILLARY
GLUCOSE-CAPILLARY: 154 mg/dL — AB (ref 70–99)
GLUCOSE-CAPILLARY: 121 mg/dL — AB (ref 70–99)
Glucose-Capillary: 143 mg/dL — ABNORMAL HIGH (ref 70–99)

## 2014-03-15 NOTE — Progress Notes (Addendum)
Physical Therapy Session Note  Patient Details  Name: [name] MRN: [id number] Date of Birth: 05-22-1927  Today's Date: 03/15/2014 Time: 1101-1157 and 4098-1191 Time Calculation (min): 56 min and 41 min  Short Term Goals: Week 1:  PT Short Term Goal 1 (Week 1): Pt will be able to complete transfers from various level surfaces/heights with min A to promote increased functional moiblity.  PT Short Term Goal 2 (Week 1): Pt will be able to ambulate 50 feet using the LRAD in order to promote functional mobility within the home, allowing pt to ambulate to the bathroom.  PT Short Term Goal 3 (Week 1): Pt will be able to tolerate 10-15 reps of LE ther ex to promote increased strength and ROM of BLE.  PT Short Term Goal 4 (Week 1): Pt will be able to recall 3/3 hip precautions to promote safety with all mobility upon discharge.  PT Short Term Goal 5 (Week 1): Pt will be able to propel whele chair 150 feet at supervision level to promote increased functional indpendence in the home and community.   Skilled Therapeutic Interventions/Progress Updates:    Treatment Session 1: Pt received seated in bedside chair with daughter, Selena Batten, present. Pt reports fatigue but agreeable to session. Pt able to verbalize 3 of 3 posterior hip precautions with mod I using visual aid. Pt required cueing 3x during session to prevent R hip internal rotation with turning to R during standing/gait. PT recommended pt turn to L to facilitate adherence to precautions. Pt verbalized understanding and exhibited effective within-session carryover. Per request of daughter, explained purpose of hip precautions and functional implications (avoiding sitting in low seats, etc.) Daughter verbalized understanding and appropriately cued pt for remainder of session.   Session focused on increasing activity tolerance and stability/independence with stair negotiation. Pt performed multiple sit<>stand transfers from bedside chair, mat table with  effective between-session carryover of cueing provided in previous sessions. Pt performed gait x65' in controlled environment with rolling walker and close supervision; gait trial ended, per pt request, secondary to pt fatigue. After seated rest break, pt negotiated 3 (5") stairs with bilat rails, forward-facing with close supervision mod multimodal cueing for sequencing, technique. After additional seated rest break, pt negotiated 3 (6") stairs as mentioned above with supervision and min verbal cueing for sequencing with effective return demonstration.  Pt requested to end session 20 minutes early secondary to fatigue. With mod coaxing, pt performed w/c mobility x100' in controlled environment with bilat UE's to increase activity tolerance. Once in room, therapist explained, demonstrated appropriate assist and cueing with stand pivot transfer from bed<>chair using rolling walker. Daughter gave effective return demonstration of assist/cueing with transfer. Safety plan updated. Therapist departed with pt seated in bedside chair with daughter present and all needs within reach.   Treatment Session 2: Pt received seated in w/c; fatigued but agreeable to therapy. Session focused on bed mobility and gait. Performed multiple supine<>sit transfers with HOB flat, no rails using leg lifter with supervision, cueing for technique. Performed sit<>stand transfers from bedside chair, from bed with rolling walker, supervision. Gait x130' in controlled environment with rolling walker, supervision. Pt with improved symmetry of step length, stance time. Session ended in pt room, where pt was left semi-reclined in bed with 3 bed rails up, bed alarm on, and all needs within reach.  Addendum: Long term goal addressing stair negotiation downgraded secondary to pt difficulty with stair negotiation due to limited R hip strength.  Therapy Documentation Precautions:  Precautions Precautions:  Posterior Hip;Fall Precaution Booklet  Issued: Yes (comment) Precaution Comments: Pt able to recall 2/3 precautions.  Restrictions Weight Bearing Restrictions: Yes RLE Weight Bearing: Weight bearing as tolerated Pain: Pain Assessment Pain Assessment: 0-10 Pain Score: 8  Pain Type: Surgical pain Pain Location: Hip Pain Orientation: Right Pain Descriptors / Indicators: Aching Pain Frequency: Intermittent Pain Onset: On-going Patients Stated Pain Goal: 2 Pain Intervention(s): Medication (See eMAR) Multiple Pain Sites: No Locomotion : Ambulation Ambulation/Gait Assistance: 5: Supervision (close supervision)   See FIM for current functional status  Therapy/Group: Individual Therapy  Infiniti Hoefling, Lorenda IshiharaBlair A 03/15/2014, 12:09 PM

## 2014-03-15 NOTE — Progress Notes (Signed)
[name] PHYSICAL MEDICINE & REHABILITATION     PROGRESS NOTE 80 y.o. right-handed female with history of diabetes mellitus with peripheral neuropathy. Patient independent prior to admission living with her husband in [place] and was visiting with plans to move to [place] to be closer to family. Admitted 03/08/2014 after a fall when she tripped over her. landing on her right hip without loss of consciousness. X-rays and imaging revealed displaced right femoral neck fracture. Underwent right arthroplasty bipolar hip 03/08/2014 per Dr. Dion Saucier.  Weightbearing as tolerated right lower extremity with posterior hip precautions. Postoperative pain management. Placed on Lovenox for DVT prophylaxis. Acute blood loss anemia 9.6 and monitored    Subjective/Complaints: Good BM yesterday.  Pt had good pain relief with tramadol Questions about recovery A 12 point review of systems has been performed and if not noted above is otherwise negative.   Objective: Vital Signs: Blood pressure 144/76, pulse 86, temperature 98.4 F (36.9 C), temperature source Oral, resp. rate 18, height 5\' 3"  (1.6 m), weight 76.204 kg (168 lb), SpO2 93.00%. No results found.  Recent Labs  03/14/14 0710  WBC 11.4*  HGB 9.8*  HCT 29.0*  PLT 294    Recent Labs  03/14/14 0710  NA 139  K 4.7  CL 101  GLUCOSE 164*  BUN 13  CREATININE 0.67  CALCIUM 9.4   CBG (last 3)   Recent Labs  03/14/14 1720 03/14/14 2115 03/15/14 0708  GLUCAP 138* 181* 154*    Wt Readings from Last 3 Encounters:  03/11/14 76.204 kg (168 lb)  03/08/14 73.483 kg (162 lb)  03/08/14 73.483 kg (162 lb)    Physical Exam:  Constitutional: She is oriented to person, place, and time.  HENT: oral mucosa pink and moist, dentition fair  Head: Normocephalic.  Eyes: EOM are normal.  Neck: Normal range of motion. Neck supple. No thyromegaly present.  Cardiovascular: Normal rate and regular rhythm. No murmurs, rubs, or gallops   Respiratory: Effort normal and breath sounds normal. No respiratory distress. No wheezes, rales, or rhonchi. Wearing [place] oxygen  GI: Soft. Bowel sounds are normal. She exhibits no distension.  Neurological: She is alert and oriented to person, place, and time.  Follows full commands. CN exam intact. Motor 5/5 bilateral UE. RHF 2-, KE 2+,R ankle 4/5. LLE 4/5 HF, KE and 5/5 at ankle. No sensory deficits. Cognitively appropriate with good insight and awareness.     Assessment/Plan: 1. Functional deficits secondary to right femoral neck fracture s/p bipolar hip replacement which require 3+ hours per day of interdisciplinary therapy in a comprehensive inpatient rehab setting. Physiatrist is providing close team supervision and 24 hour management of active medical problems listed below. Physiatrist and rehab team continue to assess barriers to discharge/monitor patient progress toward functional and medical goals. FIM: FIM - Bathing Bathing Steps Patient Completed: Chest;Right Arm;Left Arm;Abdomen;Front perineal area;Buttocks;Right upper leg;Left upper leg;Right lower leg (including foot);Left lower leg (including foot) Bathing: 4: Steadying assist  FIM - Upper Body Dressing/Undressing Upper body dressing/undressing steps patient completed: Pull shirt over trunk;Put head through opening of pull over shirt/dress;Thread/unthread left sleeve of pullover shirt/dress;Thread/unthread right sleeve of pullover shirt/dresss Upper body dressing/undressing: 5: Set-up assist to: Obtain clothing/put away FIM - Lower Body Dressing/Undressing Lower body dressing/undressing steps patient completed: Thread/unthread left underwear leg;Thread/unthread left pants leg;Pull underwear up/down Lower body dressing/undressing: 2: Max-Patient completed 25-49% of tasks  FIM - Toileting Toileting steps completed by patient: Performs perineal hygiene;Adjust clothing prior to toileting;Adjust clothing after toileting Toileting  Assistive Devices: Grab bar or rail for support Toileting: 4: Steadying assist  FIM - Diplomatic Services operational officerToilet Transfers Toilet Transfers Assistive Devices: Best boyWalker;Grab bars Toilet Transfers: 4-To toilet/BSC: Min A (steadying Pt. > 75%);4-From toilet/BSC: Min A (steadying Pt. > 75%)  FIM - Bed/Chair Transfer Bed/Chair Transfer Assistive Devices: Arm rests;Walker (leg lifter) Bed/Chair Transfer: 5: Bed > Chair or W/C: Supervision (verbal cues/safety issues);5: Chair or W/C > Bed: Supervision (verbal cues/safety issues);4: Supine > Sit: Min A (steadying Pt. > 75%/lift 1 leg);4: Sit > Supine: Min A (steadying pt. > 75%/lift 1 leg) (close supervision)  FIM - Locomotion: Wheelchair Distance: 75 Locomotion: Wheelchair: 1: Total Assistance/staff pushes wheelchair (Pt<25%) FIM - Locomotion: Ambulation Locomotion: Ambulation Assistive Devices: Designer, industrial/productWalker - Rolling Ambulation/Gait Assistance: 4: Min guard Locomotion: Ambulation: 4: Travels 150 ft or more with minimal assistance (Pt.>75%)  Comprehension Comprehension Mode: Auditory;Visual Comprehension: 5-Follows basic conversation/direction: With no assist  Expression Expression Mode: Verbal Expression: 5-Expresses basic needs/ideas: With extra time/assistive device  Social Interaction Social Interaction: 5-Interacts appropriately 90% of the time - Needs monitoring or encouragement for participation or interaction.  Problem Solving Problem Solving: 5-Solves basic 90% of the time/requires cueing < 10% of the time  Memory Memory: 4-Recognizes or recalls 75 - 89% of the time/requires cueing 10 - 24% of the time  Medical Problem List and Plan:  1. Displaced right femoral neck fracture. Status post right bipolar hip arthroplasty. Weightbearing as tolerated posterior hip precautions  2. DVT Prophylaxis/Anticoagulation: Subcutaneous Lovenox. Monitor for any bleeding episodes. Check vascular study  .  3. Pain Management: Hydrocodone and Robaxin as needed. Monitor  with increased mobility Trial tramadol 4. Neuropsych: This patient is capable of making decisions on her own behalf.  5. Acute blood loss anemia. Followup Hgb stable. Fe++ supp, diet. No signs of bleeding  6. Diabetes mellitus with peripheral neuropathy. Blood sugars showing improvement over the last 24hr.    -on Amaryl 2 mg daily.    -patient also on Glucophage 500 mg twice daily prior to admission---will resume 7. Hyperlipidemia. Zocor  8. GERD. Protonix  LOS (Days) 4 A FACE TO FACE EVALUATION WAS PERFORMED  Marissa ColaceKIRSTEINS,[name] 03/15/2014 7:54 AM

## 2014-03-15 NOTE — Progress Notes (Signed)
Occupational Therapy Session Note  Patient Details  Name: [name] MRN: [id number] Date of Birth: 05/03/1927  Today's Date: 03/15/2014 Time: 0730-0827 and [id number] Time calculation (min): 57 min and 27 min   Short Term Goals: Week 1:  OT Short Term Goal 1 (Week 1): Pt. will be supervision with grooming OT Short Term Goal 2 (Week 1): Pt. will be supervision with UB dressing OT Short Term Goal 3 (Week 1): Pt. will be supervison with LB dressing OT Short Term Goal 4 (Week 1): Pt. will be supervision with toileting OT Short Term Goal 5 (Week 1): Pt. will be min assist with shower transfer  Skilled Therapeutic Interventions/Progress Updates:    Session 1: Pt seen for ADL retraining with focus on carryover of precautions, use of AE, and functional transfers. Pt received supine in bed. Pt utilized external cues for recall of 3/3 precautions. Pt with min cues for carryover throughout bathing and dressing. Pt required min assist for management of RLE for supine>sit. Ambulated to bathroom with RW and close supervision with min cues for clearance of RLE. Pt eager for shower and completed shower transfer with min cues for technique. Pt required min cues for problem solving to utilize LH sponge for LB bathing however did recall she was unable to reach feet. Completed dressing from w/c. Pt recalled use of reacher, requiring no assist for correct use. Pt required min assist for positioning of LH shoe horn in right shoe. Pt completed sit<>stand x5 throughout session with min guard-supervision. Pt required rest breaks during dressing secondary to fatigue. At end of session pt left sitting in recliner chair eating breakfast with all needs in reach.   Session 2: Pt seen for 1:1 OT session with focus on BUE strengthening to increased independence and safety with sit<>stand during self-care tasks and functional transfers. Pt received supine in bed asleep and easily aroused. Transferred to w/c with RW at min guard  assist. Engaged in theraband exercises for BUE strengthen 2 sets 10-12 reps with focus on bicep, tricep and shoulder strengthening. Pt required rest breaks following each exercise. At end of session pt requesting to remain sitting in w/c facing window. All needs in reach.   Therapy Documentation Precautions:  Precautions Precautions: Posterior Hip;Fall Precaution Booklet Issued: Yes (comment) Precaution Comments: Pt able to recall 2/3 precautions.  Restrictions Weight Bearing Restrictions: Yes RLE Weight Bearing: Weight bearing as tolerated General:   Vital Signs: Therapy Vitals Temp: 98.4 F (36.9 C) Temp src: Oral Pulse Rate: 86 Resp: 18 BP: 144/76 mmHg Patient Position, if appropriate: Sitting Oxygen Therapy SpO2: 93 % O2 Device: None (Room air) Pain: No report of pain during therapy sessions.   See FIM for current functional status  Therapy/Group: Individual Therapy  Daneil Danerkinson, Antoinette Haskett N 03/15/2014, 8:27 AM

## 2014-03-16 ENCOUNTER — Inpatient Hospital Stay (HOSPITAL_COMMUNITY): Payer: Medicare Other | Admitting: Physical Therapy

## 2014-03-16 ENCOUNTER — Inpatient Hospital Stay (HOSPITAL_COMMUNITY): Payer: Medicare Other | Admitting: Occupational Therapy

## 2014-03-16 ENCOUNTER — Encounter (HOSPITAL_COMMUNITY): Payer: Medicare Other

## 2014-03-16 LAB — GLUCOSE, CAPILLARY
GLUCOSE-CAPILLARY: 111 mg/dL — AB (ref 70–99)
Glucose-Capillary: 93 mg/dL (ref 70–99)
Glucose-Capillary: 138 mg/dL — ABNORMAL HIGH (ref 70–99)
Glucose-Capillary: 115 mg/dL — ABNORMAL HIGH (ref 70–99)
Glucose-Capillary: 135 mg/dL — ABNORMAL HIGH (ref 70–99)

## 2014-03-16 NOTE — Progress Notes (Signed)
Social Work Lucy Chris, LCSW Social Worker Signed  Patient Care Conference Service date: 03/16/2014 1:20 PM  Inpatient RehabilitationTeam Conference and Plan of Care Update Date: 03/16/2014   Time: 10:30 AM     Patient Name: [name]       Medical Record Number: [id number]   Date of Birth: [birth year] Sex: Female         Room/Bed: 4W26C/4W26C-01 Payor Info: Payor: MEDICARE / Plan: MEDICARE PART A AND B / Product Type: *No Product type* /   Admitting Diagnosis: R hip Fx s/p R hip Hemi   Admit Date/Time:  03/11/2014  4:43 PM Admission Comments: No comment available   Primary Diagnosis:  <principal problem not specified> Principal Problem: <principal problem not specified>    Patient Active Problem List     Diagnosis  Date Noted   .  Femoral neck fracture  03/11/2014   .  Fracture of femoral neck, right  03/08/2014   .  Diabetes mellitus  03/08/2014   .  Leucocytosis  03/08/2014   .  Elevated blood pressure  03/08/2014     Expected Discharge Date: Expected Discharge Date: 03/19/14  Team Members Present: Physician leading conference: Dr. Claudette Laws Social Worker Present: Dossie Der, LCSW;Jenny Prevatt, LCSW Nurse Present: Darnelle Bos, RN PT Present: Jorje Guild, PT;Caroline Adriana Simas, PT OT Present: Scherrie November, OT SLP Present: Maxcine Ham, SLP        Current Status/Progress  Goal  Weekly Team Focus   Medical     confusion improving  with reduction of pain meds  Sup/mod I d/c goals  d/c planning   Bowel/Bladder     continent of bowel and bladder, LBM 3/24 on colace and senna BID  remain continent with regular bowel movements  assess for toileting needs   Swallow/Nutrition/ Hydration     WFL       ADL's     close supervision-min assist functional transfers, min assist LB dressing, setup assist bathing and UB dressing  mod I toilet task and transfer, supervision all other self-care tasks  recall/carryover of precautions, dynamic standing balance,  activity tolerance, family/pt education, functional transfers, UE strengthening   Mobility     Supervision to min guard with bed mobility, transfers, gait, and stair negotiation  Mod I overall with exception of supervision with 5 steps (single rail)  transfer/gait training, stair negotiation, adherence to precautions with functional mobility, activity tolerance, bed mobility, pt education, initiate D/C planning   Communication     na       Safety/Cognition/ Behavioral Observations    no unsafe behaviors       Pain     pain rating 8-9/10 R hip, however controlled with Tramadol 50mg , prefers not to have Norco d/t drowsiness  pain rating less than 5/10  monitor pain each shift, pre and post therapy and treat with prn analgesics as needed   Skin     R hip incision, steri-strips and foam dressing in place no new drainage  skin will be free of any new breakdown/infection  assess skin every shift     *See Care Plan and progress notes for long and short-term goals.    Barriers to Discharge:  family training needed      Possible Resolutions to Barriers:    fam training      Discharge Planning/Teaching Needs:    Home with daugther and caregivers who can priovide assist, plan eventually to go back to her home in [place]  Team Discussion:    Making good progress-cognition better since decreased pain medicines.  Urgency with bladder.  Preparing for discharge.   Revisions to Treatment Plan:    Downgraded goals to supervision level since someone will be there with her at discharge.    Continued Need for Acute Rehabilitation Level of Care: The patient requires daily medical management by a physician with specialized training in physical medicine and rehabilitation for the following conditions: Daily direction of a multidisciplinary physical rehabilitation program to ensure safe treatment while eliciting the highest outcome that is of practical value to the patient.: Yes Daily analysis of  laboratory values and/or radiology reports with any subsequent need for medication adjustment of medical intervention for : Post surgical problems;Other  [place], Lemar LivingsRebecca G 03/16/2014, [name], female   DOB: 07/03/1927, 80 y.o.   MRN: [id number]

## 2014-03-16 NOTE — Progress Notes (Signed)
Physical Therapy Session Note  Patient Details  Name: [name] MRN: [id number] Date of Birth: 03/02/1927  Today's Date: 03/16/2014 Time: 1127-1210 and [id number] Time Calculation (min): 43 min and 32 min  Short Term Goals: Week 1:  PT Short Term Goal 1 (Week 1): Pt will be able to complete transfers from various level surfaces/heights with min A to promote increased functional moiblity.  PT Short Term Goal 2 (Week 1): Pt will be able to ambulate 50 feet using the LRAD in order to promote functional mobility within the home, allowing pt to ambulate to the bathroom.  PT Short Term Goal 3 (Week 1): Pt will be able to tolerate 10-15 reps of LE ther ex to promote increased strength and ROM of BLE.  PT Short Term Goal 4 (Week 1): Pt will be able to recall 3/3 hip precautions to promote safety with all mobility upon discharge.  PT Short Term Goal 5 (Week 1): Pt will be able to propel whele chair 150 feet at supervision level to promote increased functional indpendence in the home and community.   Skilled Therapeutic Interventions/Progress Updates:    Treatment Session 1: Pt received seated in bedside chair with daughter, [name], present. Session focused on hands-on training with daughter, who is a licensed physical therapist. Transported pt in w/c to gym, where pt negotiated 7 standard steps with R rail to ascend, L rail to descend (to simulate home setup) with min guard-min A of daughter. Gait x160' in controlled environment with rolling walker and supervision of daughter. Pt performed sit<>stand from w/c with rolling walker, daughter providing supervision and cueing for safe hand placement. Daughter provided safe assist and appropriate cueing with all described aspects of functional mobility. Session ended in pt room, where pt was left seated in standard chair with daughter present and all needs within reach.  Treatment Session 2: Pt received seated in bedside chair; agreeable to session. Pt reports  no pain at this time. Session focused on increasing stability/independence with gait, initiating car transfer. Pt performed gait x170', x224' in controlled environment with rolling walker and supervision. Performed car transfer using leg lifter with supervision, increased time. Performed sit>supine with HOB flat, no rails with supervision, increased time using leg lifter. Therapist departed with pt semi-reclined in bed with 3 bed rails up, bed alarm on, and all needs within reach.  Long term goals downgraded to supervision secondary to daughter report that pt will have 24/7 supervision at discharge. Long term goal addressing stair negotiation modified from 5 to 7 stairs secondary to updated report of stairs at daughter's home (pt's discharge destination).   Therapy Documentation Precautions:  Precautions Precautions: Posterior Hip;Fall Precaution Booklet Issued: Yes (comment) Precaution Comments: Pt able to recall 2/3 precautions.  Restrictions Weight Bearing Restrictions: Yes RLE Weight Bearing: Weight bearing as tolerated Pain: Pain Assessment Pain Assessment: 0-10 Pain Score: 0 Locomotion : Ambulation Ambulation/Gait Assistance: 5: Supervision   See FIM for current functional status  Therapy/Group: Individual Therapy  Lallie Strahm, Lorenda IshiharaBlair A 03/16/2014, 12:22 PM

## 2014-03-16 NOTE — Progress Notes (Signed)
Occupational Therapy Session Note  Patient Details  Name: [name] MRN: [id number] Date of Birth: 01/26/1927  Today's Date: 03/16/2014 Time: 0930-1030 Time Calculation (min): 60 min  Short Term Goals: Week 1:  OT Short Term Goal 1 (Week 1): Pt. will be supervision with grooming OT Short Term Goal 2 (Week 1): Pt. will be supervision with UB dressing OT Short Term Goal 3 (Week 1): Pt. will be supervison with LB dressing OT Short Term Goal 4 (Week 1): Pt. will be supervision with toileting OT Short Term Goal 5 (Week 1): Pt. will be min assist with shower transfer  Skilled Therapeutic Interventions/Progress Updates:    Pt seen for ADL retraining with focus on adherence to precautions, standing balance, and activity tolerance. Pt received sitting in recliner chair and willing to participate in therapy at this time. Pt requesting to complete bathing at sink and declining LB bathing and dressing. Pt verbalized 2/3 precautions during session requiring cues only 2x to adhere to them during functional activity. Practiced utilizing LH shoe horn, requiring assist for R shoe. Pt completed oral care in standing and discussed setup of bathroom at home for placing items in easy to reach placed to ensure she does not break hip precautions while reaching. Pt ambulated to bathroom with RW and completed toilet task and hygiene with pt self-correcting for hygiene before flexing forward. Practiced simple meal prep task of making coffee. Pt reports she will not be doing cooking tasks besides making coffee and retrieving items from refrigerator. Practiced safety with sliding items down countertop and good carryover of this. Pt made pot of coffee at supervision with min cues for safety. At end of session pt left sitting in w/c with all needs in reach.   Therapy Documentation Precautions:  Precautions Precautions: Posterior Hip;Fall Precaution Booklet Issued: Yes (comment) Precaution Comments: Pt able to recall  2/3 precautions.  Restrictions Weight Bearing Restrictions: Yes RLE Weight Bearing: Weight bearing as tolerated General:   Vital Signs:   Pain: Pain Assessment Pain Score: 8  Pain Type: Surgical pain Pain Location: Hip Pain Orientation: Right Pain Descriptors / Indicators: Throbbing Pain Onset: On-going Patients Stated Pain Goal: 3 Pain Intervention(s): Medication (See eMAR)  See FIM for current functional status  Therapy/Group: Individual Therapy  Daneil Danerkinson, Breion Novacek N 03/16/2014, 10:47 AM

## 2014-03-16 NOTE — Progress Notes (Signed)
Social Work Patient ID: [name], female   DOB: [birth year], 80 y.o.   MRN: [id number] Met with pt and spoke with Connie-daughter via telephone to inform of team conference goals and discharge date 3/28. Goals set for supervision level since pt will have her husband and another person there with her.  Marlowe Kays has set up pt [employer] [facility] [employer] where she works[employer].  Will work toward discharge on Sat.

## 2014-03-16 NOTE — Progress Notes (Signed)
[facility] PHYSICAL MEDICINE & REHABILITATION     PROGRESS NOTE 80 y.o. right-handed female with history of diabetes mellitus with peripheral neuropathy. Patient independent prior to admission living with her husband in [place] and was visiting with plans to move to [place] to be closer to family. Admitted 03/08/2014 after a fall when she tripped over her. landing on her right hip without loss of consciousness. X-rays and imaging revealed displaced right femoral neck fracture. Underwent right arthroplasty bipolar hip 03/08/2014 per Dr. Mardelle Matte.  Weightbearing as tolerated right lower extremity with posterior hip precautions. Postoperative pain management. Placed on Lovenox for DVT prophylaxis. Acute blood loss anemia 9.6 and monitored    Subjective/Complaints: Good BM yesterday.  Pt had good pain relief with tramadol Questions about recovery A 12 point review of systems has been performed and if not noted above is otherwise negative.   Objective: Vital Signs: Blood pressure 122/73, pulse 86, temperature 97.6 F (36.4 C), temperature source Oral, resp. rate 18, height 5' 3"  (1.6 m), weight 76.204 kg (168 lb), SpO2 92.00%. No results found.  Recent Labs  03/14/14 0710  WBC 11.4*  HGB 9.8*  HCT 29.0*  PLT 294    Recent Labs  03/14/14 0710  NA 139  K 4.7  CL 101  GLUCOSE 164*  BUN 13  CREATININE 0.67  CALCIUM 9.4   CBG (last 3)   Recent Labs  03/15/14 1616 03/15/14 2108 03/16/14 0801  GLUCAP 143* 93 138*    Wt Readings from Last 3 Encounters:  03/11/14 76.204 kg (168 lb)  03/08/14 73.483 kg (162 lb)  03/08/14 73.483 kg (162 lb)    Physical Exam:  Constitutional: She is oriented to person, place, and time.  HENT: oral mucosa pink and moist, dentition fair  Head: Normocephalic.  Eyes: EOM are normal.  Neck: Normal range of motion. Neck supple. No thyromegaly present.  Cardiovascular: Normal rate and regular rhythm. No murmurs, rubs, or gallops   Respiratory: Effort normal and breath sounds normal. No respiratory distress. No wheezes, rales, or rhonchi. Wearing [place] oxygen  GI: Soft. Bowel sounds are normal. She exhibits no distension.  Neurological: She is alert and oriented to person, place, and time.  Follows full commands. CN exam intact. Motor 5/5 bilateral UE. RHF 2-, KE 2+,R ankle 4/5. LLE 4/5 HF, KE and 5/5 at ankle. No sensory deficits. Cognitively appropriate with good insight and awareness.     Assessment/Plan: 1. Functional deficits secondary to right femoral neck fracture s/p bipolar hip replacement which require 3+ hours per day of interdisciplinary therapy in a comprehensive inpatient rehab setting. Physiatrist is providing close team supervision and 24 hour management of active medical problems listed below. Physiatrist and rehab team continue to assess barriers to discharge/monitor patient progress toward functional and medical goals. Team conference today please see physician documentation under team conference tab, met with team face-to-face to discuss problems,progress, and goals. Formulized individual treatment plan based on medical history, underlying problem and comorbidities. FIM: FIM - Bathing Bathing Steps Patient Completed: Chest;Right Arm;Left Arm;Abdomen;Front perineal area;Buttocks;Right upper leg;Left upper leg;Right lower leg (including foot);Left lower leg (including foot) Bathing: 4: Steadying assist  FIM - Upper Body Dressing/Undressing Upper body dressing/undressing steps patient completed: Pull shirt over trunk;Put head through opening of pull over shirt/dress;Thread/unthread left sleeve of pullover shirt/dress;Thread/unthread right sleeve of pullover shirt/dresss Upper body dressing/undressing: 5: Set-up assist to: Obtain clothing/put away FIM - Lower Body Dressing/Undressing Lower body dressing/undressing steps patient completed: Thread/unthread left underwear leg;Thread/unthread left pants leg;Pull  underwear up/down;Thread/unthread right underwear leg;Thread/unthread right pants leg;Pull pants up/down;Don/Doff left shoe Lower body dressing/undressing: 4: Min-Patient completed 75 plus % of tasks  FIM - Toileting Toileting steps completed by patient: Performs perineal hygiene;Adjust clothing prior to toileting;Adjust clothing after toileting Toileting Assistive Devices: Grab bar or rail for support Toileting: 4: Steadying assist  FIM - Radio producer Devices: Walker;Grab bars;Elevated toilet seat Toilet Transfers: 5-To toilet/BSC: Supervision (verbal cues/safety issues) (close supervision )  FIM - Control and instrumentation engineer Devices: Arm rests;Walker (leg lifter) Bed/Chair Transfer: 5: Bed > Chair or W/C: Supervision (verbal cues/safety issues);5: Chair or W/C > Bed: Supervision (verbal cues/safety issues);5: Supine > Sit: Supervision (verbal cues/safety issues);5: Sit > Supine: Supervision (verbal cues/safety issues)  FIM - Locomotion: Wheelchair Distance: 75 Locomotion: Wheelchair: 0: Activity did not occur FIM - Locomotion: Ambulation Locomotion: Ambulation Assistive Devices: Administrator Ambulation/Gait Assistance: 5: Supervision Locomotion: Ambulation: 2: Travels 50 - 149 ft with supervision/safety issues  Comprehension Comprehension Mode: Auditory;Visual Comprehension: 5-Follows basic conversation/direction: With no assist  Expression Expression Mode: Verbal Expression: 5-Expresses basic needs/ideas: With extra time/assistive device  Social Interaction Social Interaction: 6-Interacts appropriately with others with medication or extra time (anti-anxiety, antidepressant).  Problem Solving Problem Solving: 5-Solves basic 90% of the time/requires cueing < 10% of the time  Memory Memory: 5-Recognizes or recalls 90% of the time/requires cueing < 10% of the time  Medical Problem List and Plan:  1. Displaced right  femoral neck fracture. Status post right bipolar hip arthroplasty. Weightbearing as tolerated posterior hip precautions  2. DVT Prophylaxis/Anticoagulation: Subcutaneous Lovenox. Monitor for any bleeding episodes. Check vascular study  .  3. Pain Management: Hydrocodone and Robaxin as needed. Monitor with increased mobility Trial tramadol 4. Neuropsych: This patient is capable of making decisions on her own behalf.  5. Acute blood loss anemia. Followup Hgb stable. Fe++ supp, diet. No signs of bleeding  6. Diabetes mellitus with peripheral neuropathy. Blood sugars showing improvement over the last 24hr.    -on Amaryl 2 mg daily.    -patient also on Glucophage 500 mg twice daily prior to admission---will resume 7. Hyperlipidemia. Zocor  8. GERD. Protonix  LOS (Days) 5 A FACE TO FACE EVALUATION WAS PERFORMED  Alysia Penna E 03/16/2014 9:01 AM

## 2014-03-16 NOTE — Patient Care Conference (Signed)
Inpatient RehabilitationTeam Conference and Plan of Care Update Date: 03/16/2014   Time: 10:30 AM    Patient Name: [name]      Medical Record Number: [id number]  Date of Birth: 04/26/1927 Sex: Female         Room/Bed: 4W26C/4W26C-01 Payor Info: Payor: MEDICARE / Plan: MEDICARE PART A AND B / Product Type: *No Product type* /    Admitting Diagnosis: R hip Fx s/p R hip Hemi   Admit Date/Time:  03/11/2014  4:43 PM Admission Comments: No comment available   Primary Diagnosis:  <principal problem not specified> Principal Problem: <principal problem not specified>  Patient Active Problem List   Diagnosis Date Noted  . Femoral neck fracture 03/11/2014  . Fracture of femoral neck, right 03/08/2014  . Diabetes mellitus 03/08/2014  . Leucocytosis 03/08/2014  . Elevated blood pressure 03/08/2014    Expected Discharge Date: Expected Discharge Date: 03/19/14  Team Members Present: Physician leading conference: Dr. Claudette Laws Social Worker Present: Dossie Der, LCSW;Jenny Prevatt, LCSW Nurse Present: Darnelle Bos, RN PT Present: Jorje Guild, PT;Caroline Adriana Simas, PT OT Present: Scherrie November, OT SLP Present: Maxcine Ham, SLP     Current Status/Progress Goal Weekly Team Focus  Medical   confusion improving  with reduction of pain meds  Sup/mod I d/c goals  d/c planning   Bowel/Bladder   continent of bowel and bladder, LBM 3/24 on colace and senna BID  remain continent with regular bowel movements  assess for toileting needs   Swallow/Nutrition/ Hydration     WFL        ADL's   close supervision-min assist functional transfers, min assist LB dressing, setup assist bathing and UB dressing  mod I toilet task and transfer, supervision all other self-care tasks  recall/carryover of precautions, dynamic standing balance, activity tolerance, family/pt education, functional transfers, UE strengthening   Mobility   Supervision to min guard with bed mobility, transfers,  gait, and stair negotiation  Mod I overall with exception of supervision with 5 steps (single rail)  transfer/gait training, stair negotiation, adherence to precautions with functional mobility, activity tolerance, bed mobility, pt education, initiate D/C planning   Communication     na        Safety/Cognition/ Behavioral Observations    no unsafe behaviors        Pain   pain rating 8-9/10 R hip, however controlled with Tramadol 50mg , prefers not to have Norco d/t drowsiness  pain rating less than 5/10  monitor pain each shift, pre and post therapy and treat with prn analgesics as needed   Skin   R hip incision, steri-strips and foam dressing in place no new drainage  skin will be free of any new breakdown/infection  assess skin every shift      *See Care Plan and progress notes for long and short-term goals.  Barriers to Discharge: family training needed    Possible Resolutions to Barriers:  fam training    Discharge Planning/Teaching Needs:  Home with daugther and caregivers who can priovide assist, plan eventually to go back to her home in [place]      Team Discussion:  Making good progress-cognition better since decreased pain medicines.  Urgency with bladder.  Preparing for discharge.  Revisions to Treatment Plan:  Downgraded goals to supervision level since someone will be there with her at discharge.   Continued Need for Acute Rehabilitation Level of Care: The patient requires daily medical management by a physician with specialized training in physical medicine and  rehabilitation for the following conditions: Daily direction of a multidisciplinary physical rehabilitation program to ensure safe treatment while eliciting the highest outcome that is of practical value to the patient.: Yes Daily analysis of laboratory values and/or radiology reports with any subsequent need for medication adjustment of medical intervention for : Post surgical problems;Other  Hoctor, Lemar LivingsRebecca  G 03/16/2014, 1:20 PM

## 2014-03-16 NOTE — Progress Notes (Signed)
Occupational Therapy Session Note  Patient Details  Name: [name] MRN: [id number] Date of Birth: 11/07/1927  Today's Date: 03/16/2014 Time: [id number] Time Calculation (min): 30 min  Short Term Goals: Week 1:  OT Short Term Goal 1 (Week 1): Pt. will be supervision with grooming OT Short Term Goal 2 (Week 1): Pt. will be supervision with UB dressing OT Short Term Goal 3 (Week 1): Pt. will be supervison with LB dressing OT Short Term Goal 4 (Week 1): Pt. will be supervision with toileting OT Short Term Goal 5 (Week 1): Pt. will be min assist with shower transfer  Skilled Therapeutic Interventions/Progress Updates:    Engaged in functional mobility, transfers, and BUE ther ex.  Pt in bed upon arrival, required physical assist to advance RLE to EOB.  Pt reports need to toilet, ambulated to bathroom with RW with supervision where pt completed all toileting tasks with supervision/setup with handing her toilet paper.  In therapy gym engaged in BUE ther ex with 2# dumbbell and theraband, pt able to recall exercises from yesterday and completed 2 sets of 10 each followed by new wrist and forearm exercises with 2# dumbbell.    Therapy Documentation Precautions:  Precautions Precautions: Posterior Hip;Fall Precaution Booklet Issued: Yes (comment) Precaution Comments: Pt able to recall 2/3 precautions.  Restrictions Weight Bearing Restrictions: Yes RLE Weight Bearing: Weight bearing as tolerated Pain: Pain Assessment Pain Assessment: 0-10 Pain Score: 5  Pain Type: Surgical pain Pain Location: Hip Pain Orientation: Right Pain Descriptors / Indicators: Aching Pain Onset: On-going Pain Intervention(s): Ambulation/increased activity Multiple Pain Sites: No  See FIM for current functional status  Therapy/Group: Individual Therapy  Rosalio LoudHOXIE, Katora Fini 03/16/2014, 2:50 PM

## 2014-03-16 NOTE — Progress Notes (Signed)
Physical Therapy Note  Patient Details  Name: [name] MRN: [id number] Date of Birth: 10/26/1927 Today's Date: 03/16/2014  Time: 830-900 30 minutes  1:1 Pt c/o "soreness" in R LE, RN made aware.  Gait with RW with supervision, cues for staying close to RW 110', 50'.  Stair negotiation x 3 stairs x 2 with B handrails, initially requires cuing for sequencing, on second attempt able to perform without cues with supervision.  Toilet transfers and bathroom mobility with RW with supervision.   DONAWERTH,KAREN 03/16/2014, 8:55 AM

## 2014-03-17 ENCOUNTER — Encounter (HOSPITAL_COMMUNITY): Payer: Medicare Other | Admitting: *Deleted

## 2014-03-17 ENCOUNTER — Inpatient Hospital Stay (HOSPITAL_COMMUNITY): Payer: Medicare Other

## 2014-03-17 ENCOUNTER — Inpatient Hospital Stay (HOSPITAL_COMMUNITY): Payer: Medicare Other | Admitting: Physical Therapy

## 2014-03-17 DIAGNOSIS — S72009A Fracture of unspecified part of neck of unspecified femur, initial encounter for closed fracture: Secondary | ICD-10-CM

## 2014-03-17 DIAGNOSIS — W010XXA Fall on same level from slipping, tripping and stumbling without subsequent striking against object, initial encounter: Secondary | ICD-10-CM

## 2014-03-17 LAB — GLUCOSE, CAPILLARY
GLUCOSE-CAPILLARY: 116 mg/dL — AB (ref 70–99)
GLUCOSE-CAPILLARY: 147 mg/dL — AB (ref 70–99)
Glucose-Capillary: 174 mg/dL — ABNORMAL HIGH (ref 70–99)
Glucose-Capillary: 109 mg/dL — ABNORMAL HIGH (ref 70–99)

## 2014-03-17 MED ORDER — LOPERAMIDE HCL 2 MG PO CAPS
4.0000 mg | ORAL_CAPSULE | ORAL | Status: DC | PRN
  Administered 2014-03-17: 4 mg via ORAL
  Filled 2014-03-17: qty 2

## 2014-03-17 NOTE — Progress Notes (Signed)
Physical Therapy Session Note  Patient Details  Name: [name] MRN: [id number] Date of Birth: 02/12/1927  Today's Date: 03/17/2014 Time: 0800-0902 Time Calculation (min): 62 min  Short Term Goals: Week 2:  PT Short Term Goal 1 (Week 1): STG's=LTG's secondary to ELOS  Skilled Therapeutic Interventions/Progress Updates:    Pt received seated in bedside chair; agreeable to therapy. Session focused on bed mobility, functional transfers, and gait in home environment. Pt performed multiple supine<>sit x2 trials with supervision using leg lifter for RLE management. Performed toilet transfer with supervision using rolling walker with raised toilet seat, grab bars. Pt continent of bowel and bladder; RN notified. Pt performed gait x165' in home environment with rolling walker and supervision. Pt able to verbalize 3 of 3 posterior hip precautions and did not require cueing to adhere to precautions during this session. Pt continues to require occasional subtle cueing for safe hand placement during stand pivot and sit<>stand transfers with rolling walker. Session ended in pt room, where pt was left seated in bedside chair with all needs within reach.  Therapy Documentation Precautions:  Precautions Precautions: Posterior Hip;Fall Precaution Booklet Issued: Yes (comment) Precaution Comments: Pt able to recall 2/3 precautions.  Restrictions Weight Bearing Restrictions: Yes RLE Weight Bearing: Weight bearing as tolerated Vital Signs: Therapy Vitals Temp: 98.4 F (36.9 C) Temp src: Oral Pulse Rate: 99 Resp: 18 BP: 135/79 mmHg Patient Position, if appropriate: Lying Oxygen Therapy SpO2: 94 % O2 Device: None (Room air) Pain: Pain Assessment Pain Score: 8  Pain Type: Surgical pain Pain Location: Hip Pain Orientation: Right Pain Descriptors / Indicators: Aching Pain Frequency: Constant Pain Onset: On-going Pain Intervention(s): Medication (See eMAR)  See FIM for current functional  status  Therapy/Group: Individual Therapy  Calvert CantorHobble, [name] 03/17/2014, 4:47 PM

## 2014-03-17 NOTE — Progress Notes (Signed)
[facility] PHYSICAL MEDICINE & REHABILITATION     PROGRESS NOTE 80 y.o. right-handed female with history of diabetes mellitus with peripheral neuropathy. Patient independent prior to admission living with her husband in [place] and was visiting with plans to move to [place] to be closer to family. Admitted 03/08/2014 after a fall when she tripped over her. landing on her right hip without loss of consciousness. X-rays and imaging revealed displaced right femoral neck fracture. Underwent right arthroplasty bipolar hip 03/08/2014 per Dr. Dion Saucier.  Weightbearing as tolerated right lower extremity with posterior hip precautions. Postoperative pain management. Placed on Lovenox for DVT prophylaxis. Acute blood loss anemia 9.6 and monitored    Subjective/Complaints:  Pt had good pain relief with tramadol Discussed D/C date A 12 point review of systems has been performed and if not noted above is otherwise negative.   Objective: Vital Signs: Blood pressure 136/80, pulse 81, temperature 97.9 F (36.6 C), temperature source Oral, resp. rate 18, height 5\' 3"  (1.6 m), weight 76.204 kg (168 lb), SpO2 98.00%. No results found. No results found for this basename: WBC, HGB, HCT, PLT,  in the last 72 hours No results found for this basename: NA, K, CL, CO, GLUCOSE, BUN, CREATININE, CALCIUM,  in the last 72 hours CBG (last 3)   Recent Labs  03/16/14 1644 03/16/14 2030 03/17/14 0717  GLUCAP 115* 135* 174*    Wt Readings from Last 3 Encounters:  03/11/14 76.204 kg (168 lb)  03/08/14 73.483 kg (162 lb)  03/08/14 73.483 kg (162 lb)    Physical Exam:  Constitutional: She is oriented to person, place, and time.  HENT: oral mucosa pink and moist, dentition fair  Head: Normocephalic.  Eyes: EOM are normal.  Neck: Normal range of motion. Neck supple. No thyromegaly present.  Cardiovascular: Normal rate and regular rhythm. No murmurs, rubs, or gallops  Respiratory: Effort normal and breath  sounds normal. No respiratory distress. No wheezes, rales, or rhonchi. Wearing  oxygen  GI: Soft. Bowel sounds are normal. She exhibits no distension.  Neurological: She is alert and oriented to person, place, and time.  Follows full commands. CN exam intact. Motor 5/5 bilateral UE. RHF 2-, KE 2+,R ankle 4/5. LLE 4/5 HF, KE and 5/5 at ankle. No sensory deficits. Cognitively appropriate with good insight and awareness.     Assessment/Plan: 1. Functional deficits secondary to right femoral neck fracture s/p bipolar hip replacement which require 3+ hours per day of interdisciplinary therapy in a comprehensive inpatient rehab setting. Physiatrist is providing close team supervision and 24 hour management of active medical problems listed below. Physiatrist and rehab team continue to assess barriers to discharge/monitor patient progress toward functional and medical goals.  FIM - Bathing Bathing Steps Patient Completed: Chest;Right Arm;Abdomen;Left Arm;Buttocks;Front perineal area (declining washing LB) Bathing: 5: Supervision: Safety issues/verbal cues  FIM - Upper Body Dressing/Undressing Upper body dressing/undressing steps patient completed: Thread/unthread right sleeve of front closure shirt/dress;Thread/unthread left sleeve of front closure shirt/dress;Pull shirt around back of front closure shirt/dress;Button/unbutton shirt Upper body dressing/undressing: 5: Set-up assist to: Obtain clothing/put away FIM - Lower Body Dressing/Undressing Lower body dressing/undressing steps patient completed: Don/Doff left shoe Lower body dressing/undressing: 3: Mod-Patient completed 50-74% of tasks (only donning shoes)  FIM - Toileting Toileting steps completed by patient: Performs perineal hygiene;Adjust clothing prior to toileting;Adjust clothing after toileting Toileting Assistive Devices: Grab bar or rail for support Toileting: 5: Supervision: Safety issues/verbal cues  FIM - Ambulance person Devices: Grab bars;Elevated toilet  seat;Walker Toilet Transfers: 5-To toilet/BSC: Supervision (verbal cues/safety issues);5-From toilet/BSC: Supervision (verbal cues/safety issues)  FIM - Press photographerBed/Chair Transfer Bed/Chair Transfer Assistive Devices: Arm rests;Walker (leg lifter) Bed/Chair Transfer: 5: Bed > Chair or W/C: Supervision (verbal cues/safety issues);5: Chair or W/C > Bed: Supervision (verbal cues/safety issues);5: Supine > Sit: Supervision (verbal cues/safety issues)  FIM - Locomotion: Wheelchair Distance: 75 Locomotion: Wheelchair: 1: Total Assistance/staff pushes wheelchair (Pt<25%) FIM - Locomotion: Ambulation Locomotion: Ambulation Assistive Devices: Designer, industrial/productWalker - Rolling Ambulation/Gait Assistance: 5: Supervision Locomotion: Ambulation: 5: Travels 150 ft or more with supervision/safety issues  Comprehension Comprehension Mode: Auditory;Visual Comprehension: 5-Follows basic conversation/direction: With no assist  Expression Expression Mode: Verbal Expression: 5-Expresses basic needs/ideas: With extra time/assistive device  Social Interaction Social Interaction: 6-Interacts appropriately with others with medication or extra time (anti-anxiety, antidepressant).  Problem Solving Problem Solving: 5-Solves basic 90% of the time/requires cueing < 10% of the time  Memory Memory: 5-Recognizes or recalls 90% of the time/requires cueing < 10% of the time  Medical Problem List and Plan:  1. Displaced right femoral neck fracture. Status post right bipolar hip arthroplasty. Weightbearing as tolerated posterior hip precautions  2. DVT Prophylaxis/Anticoagulation: Subcutaneous Lovenox. Monitor for any bleeding episodes. Check vascular study  .  3. Pain Management: Hydrocodone and Robaxin as needed. Monitor with increased mobility Trial tramadol 4. Neuropsych: This patient is capable of making decisions on her own behalf.  5. Acute blood loss anemia.  Followup Hgb stable. Fe++ supp, diet. No signs of bleeding  6. Diabetes mellitus with peripheral neuropathy. Blood sugars showing improvement over the last 24hr.    -on Amaryl 2 mg daily.    -patient also on Glucophage 500 mg twice daily prior to admission---will resume 7. Hyperlipidemia. Zocor  8. GERD. Protonix  LOS (Days) 6 A FACE TO FACE EVALUATION WAS PERFORMED  Erick ColaceKIRSTEINS,Jocelyn Nold E 03/17/2014 7:55 AM

## 2014-03-17 NOTE — Progress Notes (Signed)
Occupational Therapy Session Note  Patient Details  Name: [name] MRN: [id number] Date of Birth: 12/01/1927  Today's Date: 03/17/2014 Time: [id number] Time Calculation (min): 62 min  Short Term Goals: Week 1:  OT Short Term Goal 1 (Week 1): Pt. will be supervision with grooming OT Short Term Goal 2 (Week 1): Pt. will be supervision with UB dressing OT Short Term Goal 3 (Week 1): Pt. will be supervison with LB dressing OT Short Term Goal 4 (Week 1): Pt. will be supervision with toileting OT Short Term Goal 5 (Week 1): Pt. will be min assist with shower transfer  Skilled Therapeutic Interventions/Progress Updates:    Pt seen for ADL retraining with focus on adherence to precautions, functional transfers, use of AE, and activity tolerance. Pt received sitting in recliner chair and able to verbalize 3/3 precautions using external aid to identify 1/3. Eager to shower this AM. Pt ambulated to bathroom with RW at supervision level. Pt reporting needing to have BM and safely made it to toilet. Pt with good awareness as she asked therapist to retrieve toilet paper to prevent from breaking precautions. Pt then completed bathing with min cues for following hip precautions to use LH sponge for RLE. Pt having another BM after shower and notified RN of multiple loose stools. Pt reported feeling fine and agreeable to continue therapy. Completed dressing from w/c requiring min cues for use of reacher for LB dressing. Pt required 3 rest breaks throughout session d/t fatigue. At end of session pt transferred to recliner chair at supervision level and left with all needs in reach.   Therapy Documentation Precautions:  Precautions Precautions: Posterior Hip;Fall Precaution Booklet Issued: Yes (comment) Precaution Comments: Pt able to recall 2/3 precautions.  Restrictions Weight Bearing Restrictions: Yes RLE Weight Bearing: Weight bearing as tolerated General:   Vital Signs:   Pain: No report of pain  during therapy session.   See FIM for current functional status  Therapy/Group: Individual Therapy  Daneil Danerkinson, Delshawn Stech N 03/17/2014, 12:07 PM

## 2014-03-17 NOTE — Progress Notes (Signed)
Occupational Therapy Session Note  Patient Details  Name: [name] MRN: [id number] Date of Birth: 09/18/1927  Today's Date: 03/17/2014 Time: 1100-1300 Time Calculation (min): 120 min  Short Term Goals: Week 1:  OT Short Term Goal 1 (Week 1): Pt. will be supervision with grooming OT Short Term Goal 2 (Week 1): Pt. will be supervision with UB dressing OT Short Term Goal 3 (Week 1): Pt. will be supervison with LB dressing OT Short Term Goal 4 (Week 1): Pt. will be supervision with toileting OT Short Term Goal 5 (Week 1): Pt. will be min assist with shower transfer  Skilled Therapeutic Interventions/Progress Updates:  Patient with 8/10 complaints of pain, RN aware prior to outing.  Co-Treatment with TR Community outing > [employer] focusing on functional mobility/ambulation using RW, sit<>stands, overall activity tolerance/endurance, community toilet transfers & toileting, education regarding community re-entry aspects. Patient did not meet supervision sit<>stand goal during outing secondary to general fatigue. Patient did meet supervision toilet transfer, she did require min verbal cues for body mechanics & techniques. See community outing goal sheet in shadow chart for more information regarding goals. At end of session, therapist assisted patient back to bed and left with all needed items within reach.   Precautions:  Precautions Precautions: Posterior Hip;Fall Precaution Booklet Issued: Yes (comment) Precaution Comments: Pt able to recall 2/3 precautions.  Restrictions Weight Bearing Restrictions: Yes RLE Weight Bearing: Weight bearing as tolerated  See FIM for current functional status  Petra Sargeant 03/17/2014, 1:32 PM

## 2014-03-17 NOTE — Discharge Instructions (Signed)
Inpatient Rehab Discharge Instructions  [name] Discharge date and time: No discharge date for patient encounter.   Activities/Precautions/ Functional Status: Activity: weight bearing as tolerated posterio hip precautions Diet: diabetic diet Wound Care: keep wound clean and dry Functional status:  ___ No restrictions     ___ Walk up steps independently ___ 24/7 supervision/assistance   ___ Walk up steps with assistance ___ Intermittent supervision/assistance  ___ Bathe/dress independently ___ Walk with walker     ___ Bathe/dress with assistance ___ Walk Independently    ___ Shower independently __x_ Walk with assistance    ___ Shower with assistance ___ No alcohol     ___ Return to work/school ________  Special Instructions:    COMMUNITY REFERRALS UPON DISCHARGE:     Outpatient: PT  Agency:[facility] Phone: [phone] Date of Last Service:03/18/2014  Appointment Date/Time:3/30-Monday DAUGHTER SCHEDULED APPOINTMENT  Medical Equipment/Items Ordered:ROLLING Jamal MaesWALKER, 3IN1 AND TUB SEAT  Agency/Supplier:[facility]  [phone]    My questions have been answered and I understand these instructions. I will adhere to these goals and the provided educational materials after my discharge from the hospital.  Patient/Caregiver Signature _______________________________ Date __________  Clinician Signature _______________________________________ Date __________  Please bring this form and your medication list with you to all your follow-up doctor's appointments.

## 2014-03-18 ENCOUNTER — Inpatient Hospital Stay (HOSPITAL_COMMUNITY): Payer: Medicare Other

## 2014-03-18 ENCOUNTER — Inpatient Hospital Stay (HOSPITAL_COMMUNITY): Payer: Medicare Other | Admitting: Physical Therapy

## 2014-03-18 DIAGNOSIS — W010XXA Fall on same level from slipping, tripping and stumbling without subsequent striking against object, initial encounter: Secondary | ICD-10-CM

## 2014-03-18 DIAGNOSIS — S72009A Fracture of unspecified part of neck of unspecified femur, initial encounter for closed fracture: Secondary | ICD-10-CM

## 2014-03-18 LAB — CREATININE, SERUM
Creatinine, Ser: 0.66 mg/dL (ref 0.50–1.10)
GFR calc non Af Amer: 79 mL/min — ABNORMAL LOW (ref >90)

## 2014-03-18 LAB — GLUCOSE, CAPILLARY
GLUCOSE-CAPILLARY: 152 mg/dL — AB (ref 70–99)
Glucose-Capillary: 147 mg/dL — ABNORMAL HIGH (ref 70–99)
Glucose-Capillary: 91 mg/dL (ref 70–99)
Glucose-Capillary: 155 mg/dL — ABNORMAL HIGH (ref 70–99)

## 2014-03-18 MED ORDER — HYDROCODONE-ACETAMINOPHEN 5-325 MG PO TABS: 1.0000 | ORAL_TABLET | Freq: Four times a day (QID) | ORAL | Status: AC | PRN

## 2014-03-18 MED ORDER — METFORMIN HCL ER 500 MG PO TB24: 500.0000 mg | ORAL_TABLET | Freq: Two times a day (BID) | ORAL | Status: AC

## 2014-03-18 MED ORDER — METHOCARBAMOL 500 MG PO TABS: 500.0000 mg | ORAL_TABLET | Freq: Four times a day (QID) | ORAL | Status: AC | PRN

## 2014-03-18 MED ORDER — SIMVASTATIN 40 MG PO TABS: 40.0000 mg | ORAL_TABLET | Freq: Every day | ORAL | Status: AC

## 2014-03-18 MED ORDER — GLIMEPIRIDE 2 MG PO TABS: 2.0000 mg | ORAL_TABLET | Freq: Every day | ORAL | Status: AC

## 2014-03-18 MED ORDER — OMEPRAZOLE 20 MG PO CPDR: 20.0000 mg | DELAYED_RELEASE_CAPSULE | Freq: Every day | ORAL | Status: AC

## 2014-03-18 NOTE — Progress Notes (Signed)
Physical Therapy Note  Patient Details  Name: [name] MRN: [id number] Date of Birth: 05/07/1927 Today's Date: 03/18/2014  Time 1:  830-925 55 minutes  1:1 Pt c/o 7/10 pain R LE, RN made aware, rests as needed.  Gait training with RW in home and controlled environments with mod I, no LOB.  Stair negotiation 2 x 5 stairs with supervision, cues to recall sequencing when fatigued.  Curb step training for community navigation with supervision, good carryover of technique.  Car transfer to simulated SUV with min A for R LE due to pain.  Bed mobility with increased time, pt able to perform with supervision with cuing.  Pt encouraged to use leg lifter for increased independence with bed mobility at home.    Time 2: 1030-1100 30 minutes (missed 15 minutes due to fatigue)  1:1 No c/o pain.  Session focused on pt/caregiver education with pt's friend who will be assisting at d/c.  Pt/friend educated on positioning, energy conservation, HEP, home safety ideas.  Both express understanding.  Pt given written HEP and she is able to verbalize understanding.  Pt states she is ready for d/c home tomorrow.   Evonte Prestage 03/18/2014, 9:22 AM

## 2014-03-18 NOTE — Discharge Summary (Signed)
[name], [name]              ACCOUNT NO.:  [id number]  MEDICAL RECORD NO.:  [id number]  LOCATION:  [id number]                        FACILITY:  [facility]  PHYSICIAN:  Marissa Colace, M.D.DATE OF BIRTH:  [birth year]  DATE OF ADMISSION:  03/11/2014 DATE OF DISCHARGE:  03/19/2014                              DISCHARGE SUMMARY   DISCHARGE DIAGNOSES: 1. Right femoral neck fracture status post right bipolar hip     arthroplasty with posterior hip precautions. 2. Pain management. 3. Subcutaneous Lovenox for DVT prophylaxis. 4. Acute blood loss anemia. 5. Diabetes mellitus with peripheral neuropathy. 6. Hyperlipidemia. 7. Gastroesophageal reflux disease.  HISTORY OF PRESENT ILLNESS:  This is an 80 year old right-handed female, independent prior to admission living with her husband in [place], [place], was visiting, and plans to move to [place] to be close to the family.  Admitted March 08, 2014, after a fall when she tripped landing on her right hip without loss of consciousness.  X-rays and imaging revealed a displaced right femoral neck fracture.  Underwent right hip bipolar arthroplasty on March 08, 2014, per Dr. Dion Saucier. Weightbearing as tolerated with posterior hip precautions. Postoperative pain management.  Placed on subcutaneous Lovenox for DVT prophylaxis.  Acute blood loss anemia 9.6, and monitored.  Physical and occupational therapy ongoing.  The patient was admitted for comprehensive rehab program.  PAST MEDICAL HISTORY:  See discharge diagnoses.  SOCIAL HISTORY:  Lives with spouse.  FUNCTIONAL HISTORY:  Prior to admission independent.  FUNCTIONAL STATUS:  Upon admission to rehab services was moderate assist with basic transfers and mobility, ambulating 8 feet with a rolling walker.  PHYSICAL EXAMINATION:  VITAL SIGNS:  Blood pressure 126/55, pulse 89, temperature 98.3, respirations 18. GENERAL:  This was an alert female, oriented x3.  Pupils were round  and reactive to light. LUNGS:  Clear to auscultation. CARDIAC:  Regular rate and rhythm. ABDOMEN:  Soft, nontender.  Good bowel sounds. SKIN:  Hip incision clean and dry with minimal drainage.  REHABILITATION HOSPITAL COURSE:  The patient was admitted to inpatient rehab services with therapies initiated on a 3-hour daily basis consisting of physical therapy, occupational therapy, and rehabilitation nursing.  The following issues were addressed during the patient's rehabilitation stay.  Pertaining to [name]'s displaced right femoral neck fracture, surgical site healing nicely.  Weightbearing as tolerated, posterior hip precautions.  She would follow up with [facility]opedic [facility].  Subcutaneous Lovenox for DVT prophylaxis.  No bleeding episodes.  Pain management with the use of hydrocodone, Robaxin with good results.  Acute blood loss anemia.  No bleeding episodes as noted.  Latest hemoglobin 9.8.  Her blood pressures remained well controlled.  She did have a history of diabetes mellitus.  She continued on her Amaryl and Glucophage.  No bowel or bladder disturbances.  The patient received weekly collaborative interdisciplinary team conferences to discuss estimated length of stay, family teaching, and any barriers to discharge.  She was ambulating 165 feet rolling walker supervision in a controlled environment following her hip precautions.  Activities of daily living independent with hip precautions.  She was able to gather her own belongings.  Supervision with toilet transfers.  She was discharged to home.  DISCHARGE MEDICATIONS:  1. Demerol 2 mg p.o. daily. 2. Hydrocodone 1-2 tablets every 6 hours as needed moderate pain,     dispense of 90 tablets. 3. Glucophage 500 mg p.o. b.i.d. 4. Robaxin 500 mg p.o. every 6 hours as needed muscle spasms. 5. Protonix 80 mg p.o. daily. 6. Senokot 1 tablet b.i.d. 7. Zocor 40 mg p.o. daily.  DIET:  Diabetic diet.  FOLLOWUP:  The patient  would follow up with Dr. [name] at the outpatient rehab service office as needed, Dr. Dion SaucierLandau in 2 weeks, call for appointment.  SPECIAL INSTRUCTIONS:  Weightbearing as tolerated with posterior hip precautions.  Ongoing therapies arranged as per rehab services.     [name], P.A.   ______________________________ Marissa ColaceAndrew E. Hernandez, M.D.    DA/MEDQ  D:  03/18/2014  T:  03/18/2014  Job:  [id number]  cc:   Eulas PostJoshua P Landau, MD

## 2014-03-18 NOTE — Progress Notes (Addendum)
     Subjective:  Patient reports pain as mild.  She is very happy with her hospital stay.    Objective:   VITALS:   Filed Vitals:   03/17/14 0517 03/17/14 1500 03/18/14 0634 03/18/14 1518  BP: 136/80 135/79 138/79 128/73  Pulse: 81 99 81 81  Temp: 97.9 F (36.6 C) 98.4 F (36.9 C) 98.1 F (36.7 C) 97.1 F (36.2 C)  TempSrc: Oral Oral Oral Oral  Resp: 18 18 18 16   Height:      Weight:      SpO2: 98% 94% 92% 99%    Neurologically intact Dorsiflexion/Plantar flexion intact Incision: dressing C/D/I and no drainage   Lab Results  Component Value Date   WBC 11.4* 03/14/2014   HGB 9.8* 03/14/2014   HCT 29.0* 03/14/2014   MCV 97.0 03/14/2014   PLT 294 03/14/2014     Assessment/Plan:     Active Problems:   Femoral neck fracture   Advance diet Plan for discharge tomorrow Remarkable recovery, appreciate CIR help. RTC 2 weeks with me, recommend lovenox for 3 weeks from DOS, WBAT, posterior precautions.  Brendaly Townsel P 03/18/2014, 4:02 PM   Teryl LucyJoshua Ravin Bendall, MD Cell [phone]

## 2014-03-18 NOTE — Progress Notes (Signed)
[facility] PHYSICAL MEDICINE & REHABILITATION     PROGRESS NOTE 80 y.o. right-handed female with history of diabetes mellitus with peripheral neuropathy. Patient independent prior to admission living with her husband in [place] and was visiting with plans to move to [place] to be closer to family. Admitted 03/08/2014 after a fall when she tripped over her. landing on her right hip without loss of consciousness. X-rays and imaging revealed displaced right femoral neck fracture. Underwent right arthroplasty bipolar hip 03/08/2014 per Dr. Dion Saucier.  Weightbearing as tolerated right lower extremity with posterior hip precautions. Postoperative pain management. Placed on Lovenox for DVT prophylaxis. Acute blood loss anemia 9.6 and monitored    Subjective/Complaints:  Pt had good pain relief with tramadol Discussed D/C date A 12 point review of systems has been performed and if not noted above is otherwise negative.   Objective: Vital Signs: Blood pressure 138/79, pulse 81, temperature 98.1 F (36.7 C), temperature source Oral, resp. rate 18, height 5\' 3"  (1.6 m), weight 76.204 kg (168 lb), SpO2 92.00%. No results found. No results found for this basename: WBC, HGB, HCT, PLT,  in the last 72 hours  Recent Labs  03/18/14 0550  CREATININE 0.66   CBG (last 3)   Recent Labs  03/17/14 1655 03/17/14 2138 03/18/14 0734  GLUCAP 116* 147* 152*    Wt Readings from Last 3 Encounters:  03/11/14 76.204 kg (168 lb)  03/08/14 73.483 kg (162 lb)  03/08/14 73.483 kg (162 lb)    Physical Exam:  Constitutional: She is oriented to person, place, and time.  HENT: oral mucosa pink and moist, dentition fair  Head: Normocephalic.  Eyes: EOM are normal.  Neck: Normal range of motion. Neck supple. No thyromegaly present.  Cardiovascular: Normal rate and regular rhythm. No murmurs, rubs, or gallops  Respiratory: Effort normal and breath sounds normal. No respiratory distress. No wheezes, rales,  or rhonchi. Wearing [place] oxygen  GI: Soft. Bowel sounds are normal. She exhibits no distension.  Neurological: She is alert and oriented to person, place, and time.  Follows full commands. CN exam intact. Motor 5/5 bilateral UE. RHF 2-, KE 2+,R ankle 4/5. LLE 4/5 HF, KE and 5/5 at ankle. No sensory deficits. Cognitively appropriate with good insight and awareness.     Assessment/Plan: 1. Functional deficits secondary to right femoral neck fracture s/p bipolar hip replacement which require 3+ hours per day of interdisciplinary therapy in a comprehensive inpatient rehab setting. Plan D/C in am See D/C Summ  FIM - Bathing Bathing Steps Patient Completed: Chest;Right Arm;Abdomen;Left Arm;Buttocks;Front perineal area;Right upper leg;Left upper leg;Left lower leg (including foot);Right lower leg (including foot) Bathing: 5: Supervision: Safety issues/verbal cues (verbal cues)  FIM - Upper Body Dressing/Undressing Upper body dressing/undressing steps patient completed: Pull shirt over trunk;Put head through opening of pull over shirt/dress;Thread/unthread left sleeve of pullover shirt/dress;Thread/unthread right sleeve of pullover shirt/dresss;Hook/unhook bra;Thread/unthread left bra strap;Thread/unthread right bra strap Upper body dressing/undressing: 5: Set-up assist to: Obtain clothing/put away FIM - Lower Body Dressing/Undressing Lower body dressing/undressing steps patient completed: Thread/unthread right underwear leg;Thread/unthread left underwear leg;Pull underwear up/down;Thread/unthread right pants leg;Thread/unthread left pants leg;Pull pants up/down;Don/Doff left shoe Lower body dressing/undressing: 4: Min-Patient completed 75 plus % of tasks  FIM - Toileting Toileting steps completed by patient: Performs perineal hygiene;Adjust clothing prior to toileting;Adjust clothing after toileting Toileting Assistive Devices: Grab bar or rail for support Toileting: 5: Supervision: Safety  issues/verbal cues  FIM - Diplomatic Services operational officer Devices: Grab Biomedical scientist  Transfers: 5-To toilet/BSC: Supervision (verbal cues/safety issues);5-From toilet/BSC: Supervision (verbal cues/safety issues)  FIM - Press photographerBed/Chair Transfer Bed/Chair Transfer Assistive Devices: Arm rests;Walker (leg lifter) Bed/Chair Transfer: 5: Bed > Chair or W/C: Supervision (verbal cues/safety issues);5: Chair or W/C > Bed: Supervision (verbal cues/safety issues);5: Supine > Sit: Supervision (verbal cues/safety issues);5: Sit > Supine: Supervision (verbal cues/safety issues)  FIM - Locomotion: Wheelchair Distance: 75 Locomotion: Wheelchair: 1: Total Assistance/staff pushes wheelchair (Pt<25%) FIM - Locomotion: Ambulation Locomotion: Ambulation Assistive Devices: Designer, industrial/productWalker - Rolling Ambulation/Gait Assistance: 5: Supervision Locomotion: Ambulation: 5: Travels 150 ft or more with supervision/safety issues  Comprehension Comprehension Mode: Auditory;Visual Comprehension: 5-Follows basic conversation/direction: With no assist  Expression Expression Mode: Verbal Expression: 5-Expresses basic needs/ideas: With extra time/assistive device  Social Interaction Social Interaction: 6-Interacts appropriately with others with medication or extra time (anti-anxiety, antidepressant).  Problem Solving Problem Solving: 5-Solves basic 90% of the time/requires cueing < 10% of the time  Memory Memory: 5-Recognizes or recalls 90% of the time/requires cueing < 10% of the time  Medical Problem List and Plan:  1. Displaced right femoral neck fracture. Status post right bipolar hip arthroplasty. Weightbearing as tolerated posterior hip precautions  2. DVT Prophylaxis/Anticoagulation: Subcutaneous Lovenox. Monitor for any bleeding episodes. Check vascular study  .  3. Pain Management: Hydrocodone and Robaxin as needed. Monitor with increased mobility Trial tramadol 4. Neuropsych:  This patient is capable of making decisions on her own behalf.  5. Acute blood loss anemia. Followup Hgb stable. Fe++ supp, diet. No signs of bleeding  6. Diabetes mellitus with peripheral neuropathy. Blood sugars showing improvement over the last 24hr.    -on Amaryl 2 mg daily.    -patient also on Glucophage 500 mg twice daily prior to admission---will resume 7. Hyperlipidemia. Zocor  8. GERD. Protonix  LOS (Days) 7 A FACE TO FACE EVALUATION WAS PERFORMED  Claudette LawsKIRSTEINS,ANDREW E 03/18/2014 8:11 AM

## 2014-03-18 NOTE — Discharge Summary (Signed)
Discharge summary job [id number]

## 2014-03-18 NOTE — Progress Notes (Signed)
Occupational Therapy Discharge Summary  Patient Details  Name: [name] MRN: [id number] Date of Birth: November 23, 1927  Today's Date: 03/18/2014 Time: 0730-0825 and [id number] Time calculation (min): 55 min and 27 min   Patient has met 12 of 12 long term goals due to improved activity tolerance, improved balance, postural control, ability to compensate for deficits, improved awareness and improved coordination.  Patient to discharge at overall supervision-modified independent level.  Patient's care partner is independent to provide the necessary physical and cognitive assistance at discharge.    Reasons goals not met: N/A. All LTGs met.   Recommendation:  No skilled occupational therapy recommended at this time.   Equipment: BSC, shower chair  Reasons for discharge: treatment goals met and discharge from hospital  Patient/family agrees with progress made and goals achieved: Yes  Skilled Therapeutic Interventoin Session 1: Pt seen for ADL retraining with focus on adherence to precautions during functional activities, activity tolerance, and standing balance. Pt ambulating throughout room and bathroom with RW at supervision level. Pt completed toilet transfer, toilet task, and bathing at Mod I level using LH sponge for LB dressing. Pt required min cues for correct use of reacher for LB dressing. Pt with recall and carryover of precautions at Mod I level. Pt verbalized 3/3 precautions at beginning of session with no external cues. Utilized teach back method for education with community re-entry. Pt identified 3 barriers to be aware of when in public setting. Pt went on outing yesterday and identified 2 things she learned from outing. Pt eager to go home and demonstrating improved safety awareness today. Pt with improved activity tolerance as well, requiring few rest breaks during ADL session. Pt left sitting in recliner chair finishing breakfast with all needs in reach.   Session 2: Pt seen for  1:1 OT session with focus on safety awareness, activity tolerance, and functional transfers. Pt received sitting in recliner requesting to go to bathroom. Ambulated to bathroom and completed toilet task at mod I. Pt ambulated around gift shop at hospital at supervision level with increased time. Pt tolerated approx 8 min before requiring rest break and able to safely locate chair to sit in. Pt with good safety when navigating around obstacles in close environment. At end of session pt returned to room and discussed discharge. Pt has HEP with theraband and no questions reported. Pt eager to return home. Pt left sitting in w/c with all needs in reach.   OT Discharge Precautions/Restrictions  Precautions Precautions: Posterior Hip;Fall Restrictions Weight Bearing Restrictions: Yes RLE Weight Bearing: Weight bearing as tolerated General   Vital Signs Therapy Vitals Temp: 98.1 F (36.7 C) Temp src: Oral Pulse Rate: 81 Resp: 18 BP: 138/79 mmHg Patient Position, if appropriate: Lying Oxygen Therapy SpO2: 92 % O2 Device: None (Room air) Pain Pain Assessment Pain Assessment: No/denies pain ADL   Vision/Perception  Vision - History Baseline Vision: Bifocals Patient Visual Report: No change from baseline Vision - Assessment Eye Alignment: Within Functional Limits  Cognition Overall Cognitive Status: Within Functional Limits for tasks assessed Arousal/Alertness: Awake/alert Orientation Level: Oriented X4 Attention: Sustained Sustained Attention: Appears intact Memory: Appears intact Awareness: Appears intact Problem Solving: Appears intact Safety/Judgment: Appears intact Sensation Sensation Light Touch: Appears Intact Coordination Gross Motor Movements are Fluid and Coordinated: Yes Fine Motor Movements are Fluid and Coordinated: Yes Motor    Mobility     Trunk/Postural Assessment     Balance   Extremity/Trunk Assessment RUE Assessment RUE Assessment: Within Functional  Limits LUE Assessment LUE  Assessment: Within Functional Limits  See FIM for current functional status  [name], Quillian Quince 03/18/2014, 8:14 AM

## 2014-03-18 NOTE — Progress Notes (Signed)
Social Work Discharge Note Discharge Note  The overall goal for the admission was met for:   Discharge location: Yes-HOME WITH DAUGHTER AND PT'S HUSBAND  Length of Stay: Yes-8 DAYS  Discharge activity level: Yes-SUPERVISION/MOD/I LEVEL  Home/community participation: Yes  Services provided included: MD, RD, PT, OT, RN, CM, TR, Pharmacy and SW  Financial Services: Medicare and Private Insurance: [place]  Follow-up services arranged: Outpatient: [facility]-3/30 MONDAY-DAUGHTER ARRANGED, DME: [facility]-ROLLING WALKER, BSC, TUB SEAT and Patient/Family has no preference for HH/DME agencies  Comments (or additional information):FAMILY EDUCATION WENT WELL, PT WILL HAVE 24 HR CARE-READY FOR DISCHARGE DAUGHTER IS A PT AND AWARE OF PT'S NEEDS.  Patient/Family verbalized understanding of follow-up arrangements: Yes  Individual responsible for coordination of the follow-up plan: SELF & CONNIE-DAUGHTER  Confirmed correct DME delivered: [name], [name] 03/18/2014    Elease Hashimoto

## 2014-03-18 NOTE — Progress Notes (Signed)
Social Work Patient ID: [name], female   DOB: 09/02/1927, 80 y.o.   MRN: [id number] Spoke with daughter regarding discharge tomorrow.  Has all DME and follow up arranged.  Her friend here to learn Mom's care today and both feel Ready for discharge tomorrow.  Pt is tired today thinks from outing yesterday.

## 2014-03-19 DIAGNOSIS — S72009A Fracture of unspecified part of neck of unspecified femur, initial encounter for closed fracture: Secondary | ICD-10-CM

## 2014-03-19 DIAGNOSIS — E119 Type 2 diabetes mellitus without complications: Secondary | ICD-10-CM

## 2014-03-19 DIAGNOSIS — R030 Elevated blood-pressure reading, without diagnosis of hypertension: Secondary | ICD-10-CM

## 2014-03-19 LAB — GLUCOSE, CAPILLARY: Glucose-Capillary: 130 mg/dL — ABNORMAL HIGH (ref 70–99)

## 2014-03-19 NOTE — Progress Notes (Signed)
[name] is a 80 y.o. female 01/25/1927 [id number]  Subjective: C/o  Chronic urinary frequency. No new problems. Slept well. Feeling OK.  Objective: Vital signs in last 24 hours: Temp:  [97.1 F (36.2 C)-97.8 F (36.6 C)] 97.8 F (36.6 C) (03/28 0504) Pulse Rate:  [81-84] 84 (03/28 0504) Resp:  [16] 16 (03/28 0504) BP: (128-151)/(73-81) 151/81 mmHg (03/28 0504) SpO2:  [92 %-99 %] 92 % (03/28 0504) Weight change:  Last BM Date: 03/18/14  Intake/Output from previous day: 03/27 0701 - 03/28 0700 In: 720 [P.O.:720] Out: -  Last cbgs: CBG (last 3)   Recent Labs  03/18/14 1646 03/18/14 2039 03/19/14 0737  GLUCAP 91 155* 130*     Physical Exam General: No apparent distress   HEENT: not dry Lungs: Normal effort. Lungs clear to auscultation, no crackles or wheezes. Cardiovascular: Regular rate and rhythm, no edema Abdomen: S/NT/ND; BS(+) Musculoskeletal:  unchanged Neurological: No new neurological deficits Wounds: N/A    Skin: clear  Aging changes Mental state: Alert, oriented, cooperative In a w/c   Lab Results: BMET    Component Value Date/Time   NA 139 03/14/2014 0710   K 4.7 03/14/2014 0710   CL 101 03/14/2014 0710   CO2 25 03/14/2014 0710   GLUCOSE 164* 03/14/2014 0710   BUN 13 03/14/2014 0710   CREATININE 0.66 03/18/2014 0550   CALCIUM 9.4 03/14/2014 0710   GFRNONAA 79* 03/18/2014 0550   GFRAA >90 03/18/2014 0550   CBC    Component Value Date/Time   WBC 11.4* 03/14/2014 0710   RBC 2.99* 03/14/2014 0710   HGB 9.8* 03/14/2014 0710   HCT 29.0* 03/14/2014 0710   PLT 294 03/14/2014 0710   MCV 97.0 03/14/2014 0710   MCH 32.8 03/14/2014 0710   MCHC 33.8 03/14/2014 0710   RDW 13.4 03/14/2014 0710   LYMPHSABS 1.7 03/14/2014 0710   MONOABS 1.3* 03/14/2014 0710   EOSABS 0.4 03/14/2014 0710   BASOSABS 0.1 03/14/2014 0710    Studies/Results: No results found.  Medications: I have reviewed the patient's current medications.  Assessment/Plan:   1. Displaced right  femoral neck fracture. Status post right bipolar hip arthroplasty. Weightbearing as tolerated posterior hip precautions  2. DVT Prophylaxis/Anticoagulation: Subcutaneous Lovenox. Monitor for any bleeding episodes. Check vascular study .  3. Pain Management: Hydrocodone and Robaxin as needed. Monitor with increased mobility Trial tramadol  4. Neuropsych: This patient is capable of making decisions on her own behalf.  5. Acute blood loss anemia. Followup Hgb stable. Fe++ supp, diet. No signs of bleeding  6. Diabetes mellitus with peripheral neuropathy. Blood sugars showing improvement over the last 24hr.  -on Amaryl 2 mg daily.  -patient also on Glucophage 500 mg twice daily prior to admission---will resume  7. Hyperlipidemia. Zocor  8. GERD. Protonix 9. Chronic urinary frequency. Will try Myrbetriq. Rx given 10. D/c home  Length of stay, days: 8  Sonda PrimesAlex Lakeva Hollon , MD 03/19/2014, 8:10 AM

## 2014-03-19 NOTE — Progress Notes (Signed)
Physical Therapy Discharge Summary  Patient Details  Name: [name] MRN: [id number] Date of Birth: 1927/12/08  Today's Date: 03/19/2014  Patient has met 7 of 8 long term goals due to improved activity tolerance, improved balance, improved postural control, increased strength, increased range of motion, decreased pain, ability to compensate for deficits, functional use of  right lower extremity and improved knowledge of posterior hip precautions.  Patient to discharge at an ambulatory level Supervision.   Patient's care partner is independent to provide the necessary physical assistance at discharge.  Reasons goals not met: Long term goal addressing car transfers not met secondary to pt inconsistency performing with supervision. Pt required min A for RLE management on day of discharge secondary to RLE pain.  Recommendation:  Patient will benefit from ongoing skilled PT services in outpatient setting to continue to advance safe functional mobility, address ongoing impairments in stability/independence with functional mobility, and minimize fall risk.  Equipment: rolling walker  Reasons for discharge: treatment goals met and discharge from hospital  Patient/family agrees with progress made and goals achieved: Yes  PT Discharge Precautions/Restrictions Precautions Precautions: Posterior Hip;Fall Restrictions Weight Bearing Restrictions: Yes RLE Weight Bearing: Weight bearing as tolerated Vision/Perception  Vision - History Baseline Vision: Bifocals Patient Visual Report: No change from baseline Vision - Assessment Eye Alignment: Within Functional Limits  Cognition Overall Cognitive Status: Within Functional Limits for tasks assessed Arousal/Alertness: Awake/alert Orientation Level: Oriented X4 Attention: Sustained Sustained Attention: Appears intact Memory: Appears intact Awareness: Appears intact Problem Solving: Appears intact Safety/Judgment: Appears  intact Sensation Sensation Light Touch: Appears Intact Coordination Gross Motor Movements are Fluid and Coordinated: Yes Fine Motor Movements are Fluid and Coordinated: Yes Motor  Motor Motor: Within Functional Limits Motor - Discharge Observations: Improved RLE strength as compared with PT evaluation  Mobility Bed Mobility Bed Mobility: Rolling Right;Sitting - Scoot to Edge of Bed;Supine to Sit;Rolling Left;Right Sidelying to Sit Rolling Right: 5: Supervision Rolling Left: 5: Supervision Right Sidelying to Sit: 5: Supervision;HOB flat Supine to Sit: 5: Supervision;HOB flat Sitting - Scoot to Edge of Bed: 5: Supervision Transfers Transfers: Yes Sit to Stand: 5: Supervision Sit to Stand Details (indicate cue type and reason): with rolling walker Stand to Sit: 5: Supervision Stand to Sit Details: with rolling walker Stand Pivot Transfers: 5: Supervision Stand Pivot Transfer Details (indicate cue type and reason): with rolling walker Locomotion  Ambulation Ambulation: Yes Ambulation/Gait Assistance: 5: Supervision Ambulation Distance (Feet): 220 Feet Assistive device: Rolling walker Ambulation/Gait Assistance Details: Pt able to ambulate 220' consecutively (without rest break) in controlled, home, and community environments with rolling walker and supervision. Gait Gait: Yes Gait Pattern: Impaired Gait Pattern:  (Gait pattern grossly WFL with exception of onset decreased RLE stance time, limited LLE step length with increased fatigue, pain.) Gait velocity: Less than normal High Level Ambulation High Level Ambulation: Side stepping Side Stepping: Supervision using rolling walker or with bilat UE support at stable surface Stairs / Additional Locomotion Stairs: Yes Stairs Assistance: 4: Min assist Stair Management Technique: One rail Right;Forwards Number of Stairs: 7 Ramp: 5: Supervision;Other (comment) (using rolling walker) Curb: 5: Supervision;Other (comment) (using  rolling walker) Wheelchair Mobility Wheelchair Mobility: Yes Wheelchair Assistance: 5: Supervision Wheelchair Propulsion: Both upper extremities Wheelchair Parts Management: Supervision/cueing;Needs assistance;Other (comment) (supervision/cueing with w/c brake management; assist needed with leg rest management to adhere to hip precautions) Distance: 150  Trunk/Postural Assessment  Cervical Assessment Cervical Assessment: Within Functional Limits Thoracic Assessment Thoracic Assessment: Within Functional Limits Lumbar Assessment Lumbar Assessment: Within  Functional Limits Postural Control Postural Control: Within Functional Limits  Balance Balance Balance Assessed: Yes Static Sitting Balance Static Sitting - Balance Support: No upper extremity supported;Feet supported Static Sitting - Level of Assistance: 7: Independent Dynamic Sitting Balance Dynamic Sitting - Balance Support: Feet supported;During functional activity;Right upper extremity supported Dynamic Sitting - Level of Assistance: 6: Modified independent (Device/Increase time) Dynamic Sitting - Balance Activities: Reaching for objects;Lateral lean/weight shifting Sitting balance - Comments: During toileting x3 minutes. Static Standing Balance Static Standing - Balance Support: Bilateral upper extremity supported Static Standing - Level of Assistance: 6: Modified independent (Device/Increase time) Static Standing - Comment/# of Minutes: Static standing with bilat UE support at rolling walker >1 minute with mod I; no LOB. Dynamic Standing Balance Dynamic Standing - Balance Support: Bilateral upper extremity supported;During functional activity Dynamic Standing - Level of Assistance: 5: Stand by assistance Extremity Assessment  RUE Assessment RUE Assessment: Within Functional Limits LUE Assessment LUE Assessment: Within Functional Limits RLE Assessment RLE Assessment: Exceptions to [facility] RLE Strength Right Hip Flexion:  3+/5 Right Knee Flexion: 4/5 Right Knee Extension: 4/5 Right Ankle Dorsiflexion: 5/5 Right Ankle Plantar Flexion: 5/5 LLE Assessment LLE Assessment: Within Functional Limits  See FIM for current functional status  [place], [name] 03/19/2014, 6:19 PM

## 2015-10-09 IMAGING — CR DG HIP COMPLETE 2+V*R*
3 series · 3 of 3 positions shown · non-contrast
Comparison: None.

CLINICAL DATA: Fall.  Right hip pain.

EXAM:
RIGHT HIP - COMPLETE 2+ VIEW

[t pelvis a.p.]
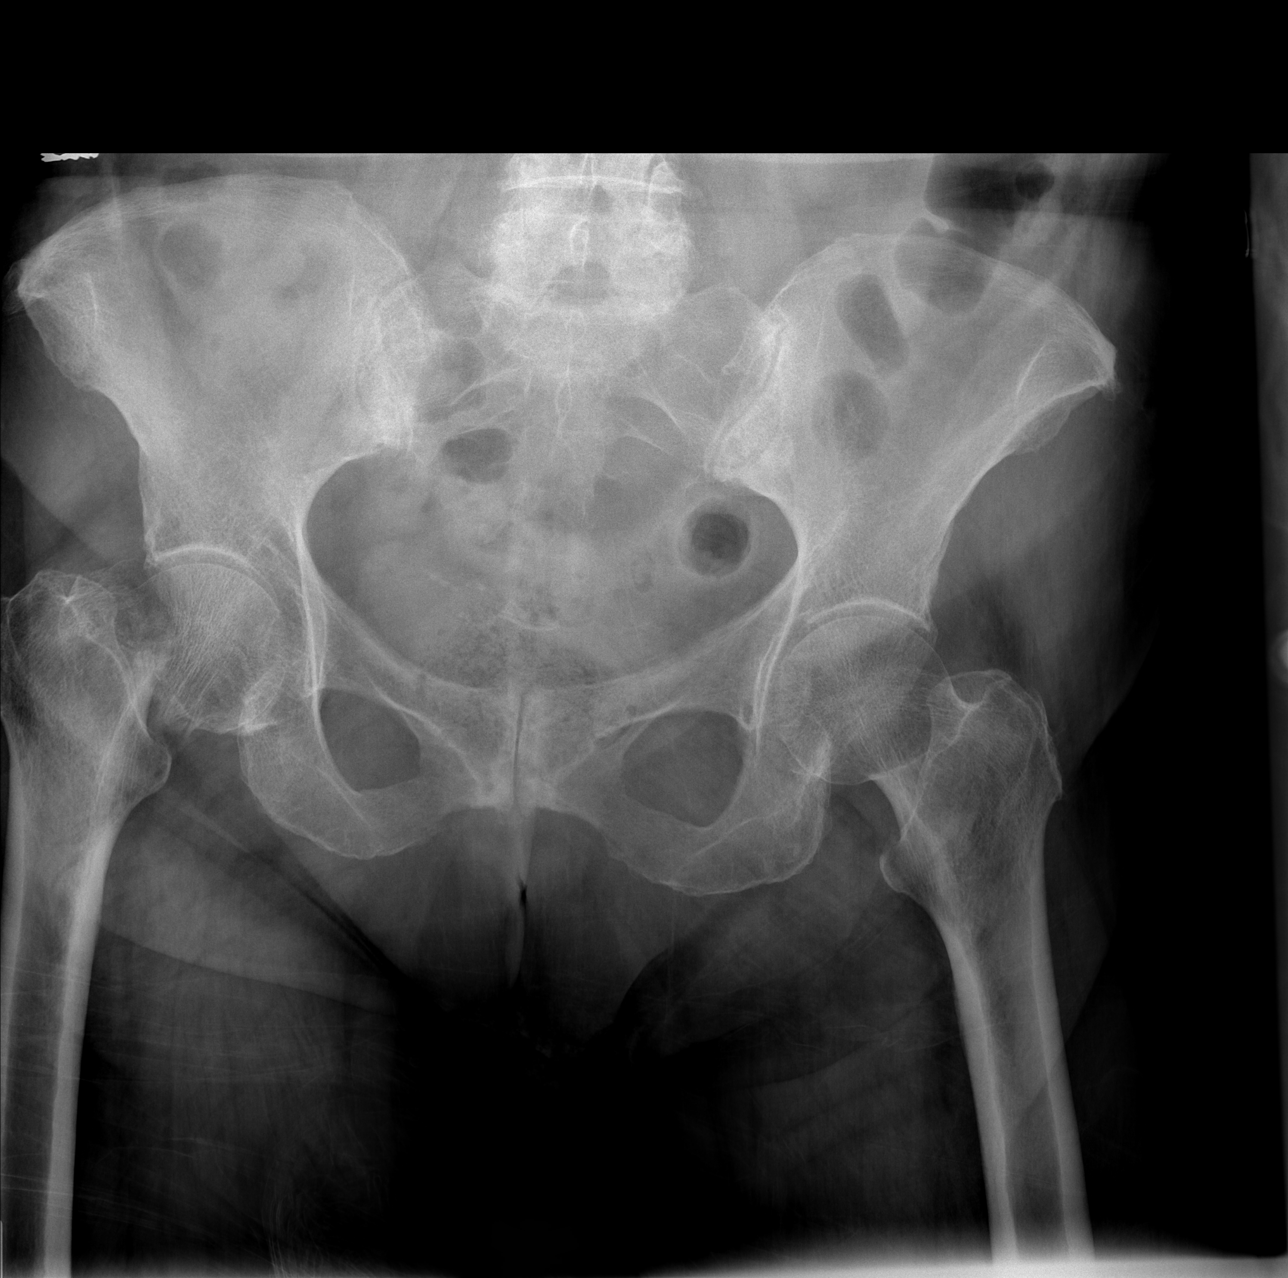

[t hip ap right]
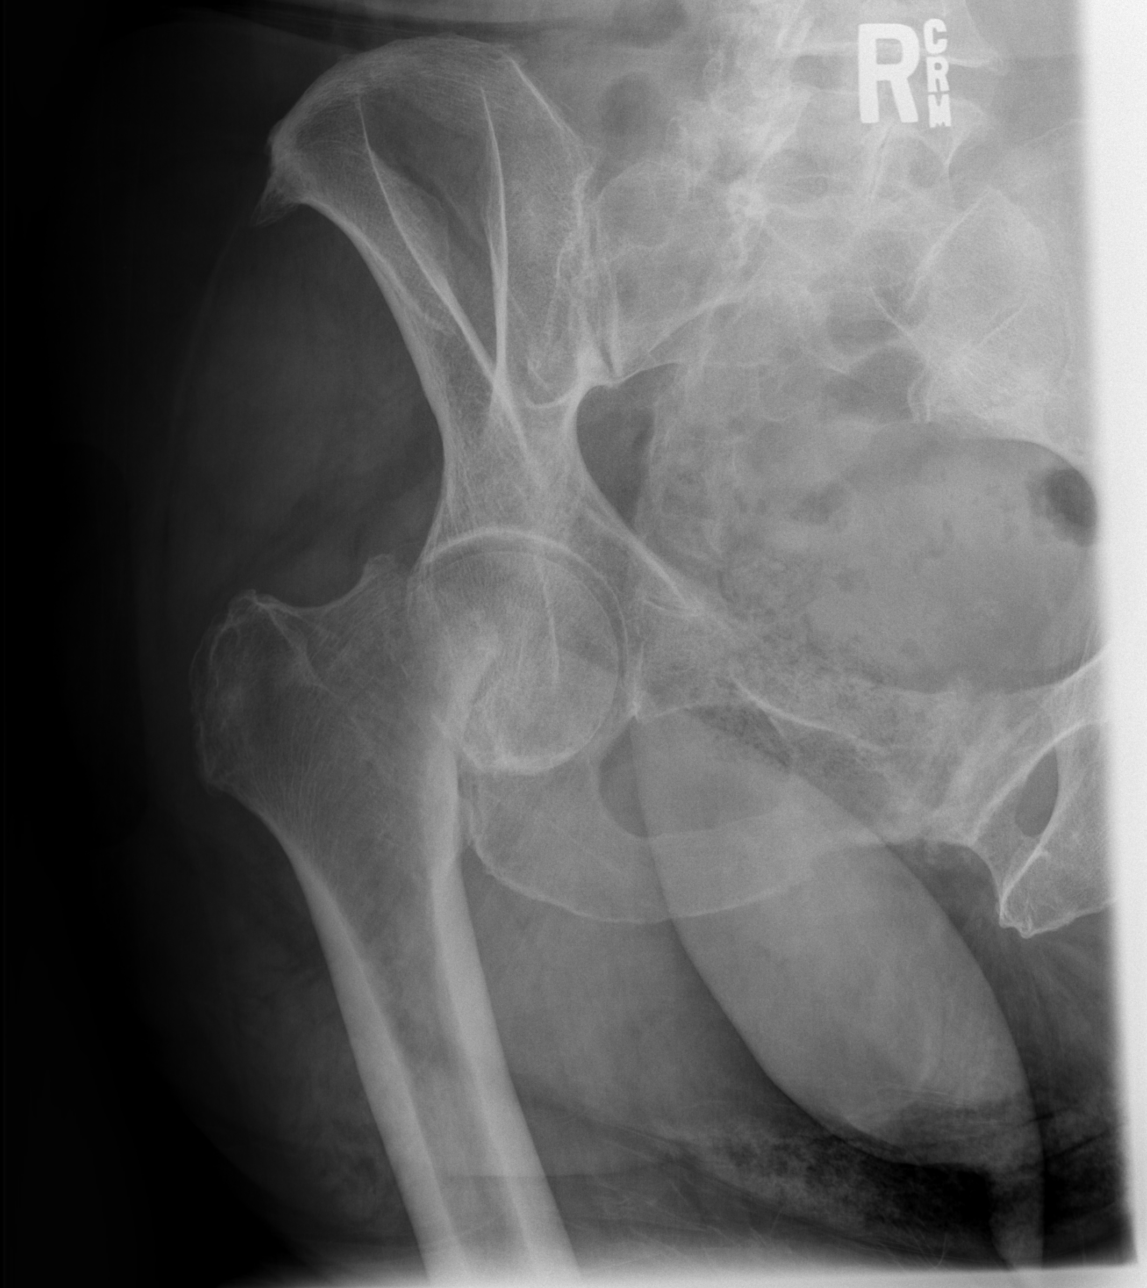

[t hip frog leg right]
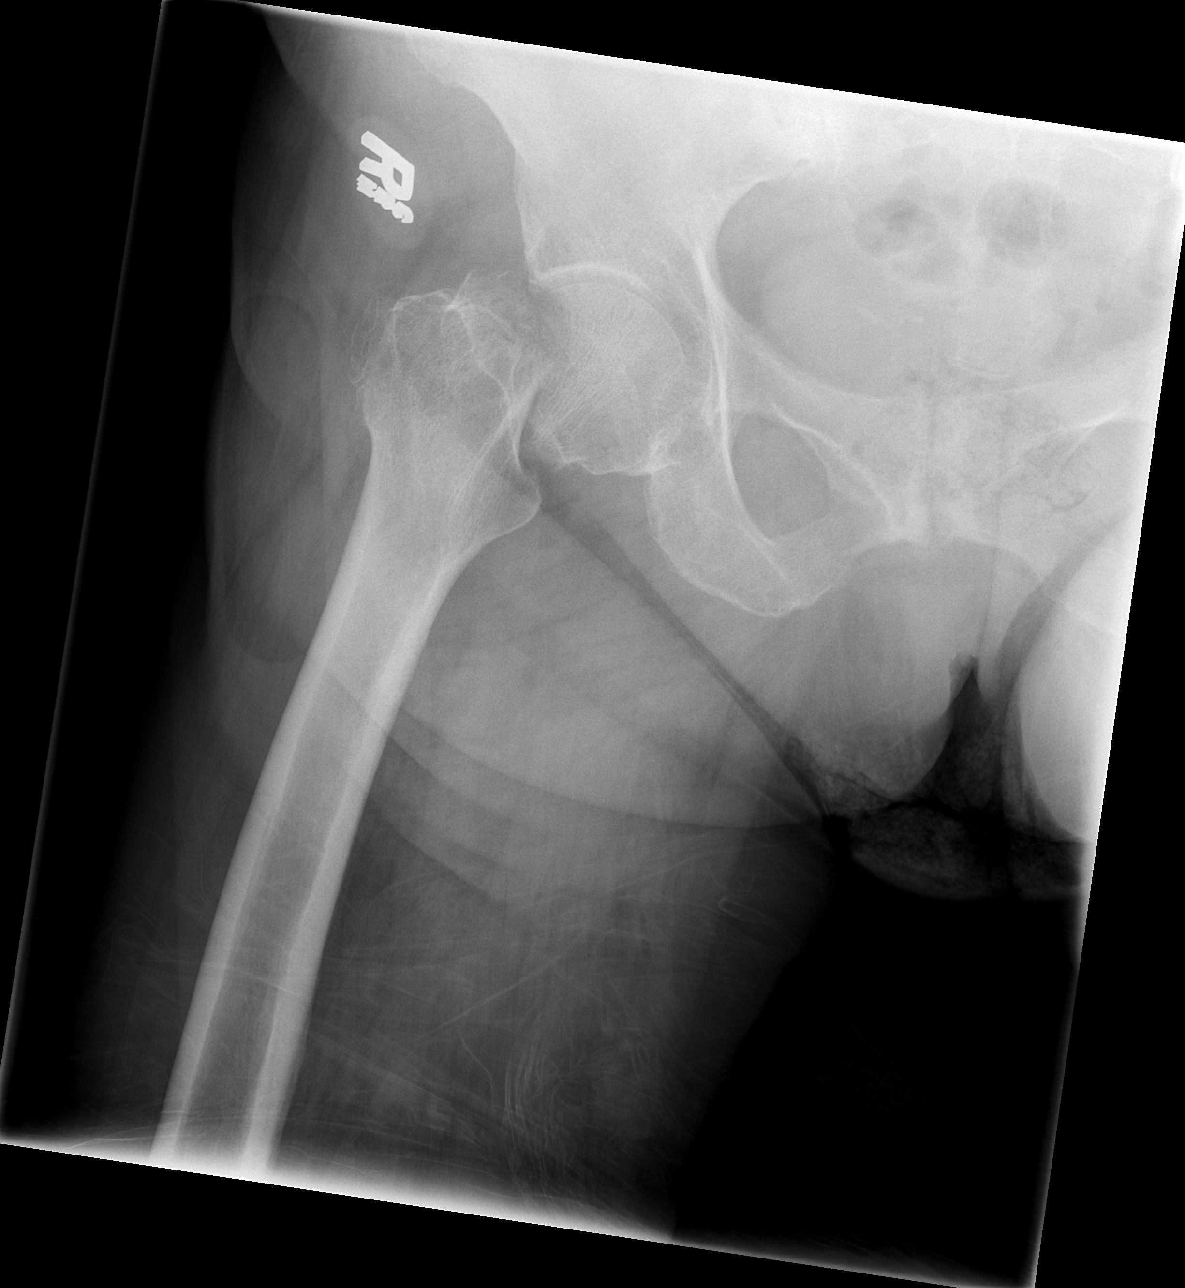

[3 of 3 positions shown; findings below may reference images not displayed]

FINDINGS: The patient has an acute subcapital fracture of the right hip. No
other acute bony or joint abnormality is identified.
IMPRESSION: Acute subcapital fracture right hip.

## 2016-08-23 DEATH — deceased
